# Patient Record
Sex: Male | Born: 1940 | Race: White | Hispanic: No | Marital: Married | Smoking: Never smoker
Health system: Southern US, Community
[De-identification: ages and names within clinical notes are randomized; demographics above are authoritative.]

## PROBLEM LIST (undated history)

## (undated) DIAGNOSIS — K635 Polyp of colon: Secondary | ICD-10-CM

## (undated) DIAGNOSIS — R112 Nausea with vomiting, unspecified: Secondary | ICD-10-CM

## (undated) DIAGNOSIS — N529 Male erectile dysfunction, unspecified: Secondary | ICD-10-CM

## (undated) DIAGNOSIS — I509 Heart failure, unspecified: Secondary | ICD-10-CM

## (undated) DIAGNOSIS — I2510 Atherosclerotic heart disease of native coronary artery without angina pectoris: Secondary | ICD-10-CM

## (undated) DIAGNOSIS — K219 Gastro-esophageal reflux disease without esophagitis: Secondary | ICD-10-CM

## (undated) DIAGNOSIS — F329 Major depressive disorder, single episode, unspecified: Secondary | ICD-10-CM

## (undated) DIAGNOSIS — N289 Disorder of kidney and ureter, unspecified: Secondary | ICD-10-CM

## (undated) DIAGNOSIS — G479 Sleep disorder, unspecified: Secondary | ICD-10-CM

## (undated) DIAGNOSIS — F32A Depression, unspecified: Secondary | ICD-10-CM

## (undated) DIAGNOSIS — Z9889 Other specified postprocedural states: Secondary | ICD-10-CM

## (undated) DIAGNOSIS — L301 Dyshidrosis [pompholyx]: Secondary | ICD-10-CM

## (undated) DIAGNOSIS — M1990 Unspecified osteoarthritis, unspecified site: Secondary | ICD-10-CM

## (undated) DIAGNOSIS — G2581 Restless legs syndrome: Secondary | ICD-10-CM

## (undated) HISTORY — PX: COLONOSCOPY: SHX174

## (undated) HISTORY — PX: UPPER GASTROINTESTINAL ENDOSCOPY: SHX188

## (undated) HISTORY — DX: Major depressive disorder, single episode, unspecified: F32.9

## (undated) HISTORY — DX: Polyp of colon: K63.5

## (undated) HISTORY — DX: Restless legs syndrome: G25.81

## (undated) HISTORY — DX: Sleep disorder, unspecified: G47.9

## (undated) HISTORY — DX: Nausea with vomiting, unspecified: Z98.890

## (undated) HISTORY — DX: Dyshidrosis (pompholyx): L30.1

## (undated) HISTORY — DX: Heart failure, unspecified: I50.9

## (undated) HISTORY — DX: Atherosclerotic heart disease of native coronary artery without angina pectoris: I25.10

## (undated) HISTORY — DX: Nausea with vomiting, unspecified: R11.2

## (undated) HISTORY — DX: Male erectile dysfunction, unspecified: N52.9

## (undated) HISTORY — DX: Unspecified osteoarthritis, unspecified site: M19.90

## (undated) HISTORY — DX: Gastro-esophageal reflux disease without esophagitis: K21.9

## (undated) HISTORY — PX: KIDNEY STONE SURGERY: SHX686

## (undated) HISTORY — DX: Depression, unspecified: F32.A

---

## 1994-06-10 HISTORY — PX: ESOPHAGOGASTRIC FUNDOPLICATION: SHX405

## 1997-12-02 ENCOUNTER — Emergency Department (HOSPITAL_COMMUNITY): Admission: EM | Admit: 1997-12-02 | Discharge: 1997-12-02 | Payer: Self-pay | Admitting: *Deleted

## 2000-04-28 ENCOUNTER — Ambulatory Visit (HOSPITAL_COMMUNITY): Admission: RE | Admit: 2000-04-28 | Discharge: 2000-04-28 | Payer: Self-pay | Admitting: Orthopedic Surgery

## 2000-04-28 ENCOUNTER — Encounter: Payer: Self-pay | Admitting: Orthopedic Surgery

## 2003-05-11 HISTORY — PX: CARPAL TUNNEL RELEASE: SHX101

## 2003-05-20 ENCOUNTER — Ambulatory Visit (HOSPITAL_BASED_OUTPATIENT_CLINIC_OR_DEPARTMENT_OTHER): Admission: RE | Admit: 2003-05-20 | Discharge: 2003-05-20 | Payer: Self-pay | Admitting: Orthopedic Surgery

## 2004-02-27 ENCOUNTER — Encounter: Payer: Self-pay | Admitting: Internal Medicine

## 2004-07-23 ENCOUNTER — Ambulatory Visit: Payer: Self-pay | Admitting: Internal Medicine

## 2004-08-02 ENCOUNTER — Ambulatory Visit: Payer: Self-pay | Admitting: Internal Medicine

## 2004-12-19 ENCOUNTER — Encounter: Admission: RE | Admit: 2004-12-19 | Discharge: 2004-12-19 | Payer: Self-pay | Admitting: Sports Medicine

## 2005-04-12 ENCOUNTER — Ambulatory Visit: Payer: Self-pay | Admitting: Internal Medicine

## 2005-07-31 ENCOUNTER — Encounter: Payer: Self-pay | Admitting: Internal Medicine

## 2005-10-31 ENCOUNTER — Ambulatory Visit: Payer: Self-pay | Admitting: Internal Medicine

## 2005-11-03 ENCOUNTER — Encounter: Admission: RE | Admit: 2005-11-03 | Discharge: 2005-11-03 | Payer: Self-pay | Admitting: Orthopedic Surgery

## 2005-12-03 ENCOUNTER — Ambulatory Visit (HOSPITAL_COMMUNITY): Admission: RE | Admit: 2005-12-03 | Discharge: 2005-12-03 | Payer: Self-pay | Admitting: Orthopedic Surgery

## 2005-12-18 ENCOUNTER — Ambulatory Visit: Payer: Self-pay | Admitting: Gastroenterology

## 2006-01-03 ENCOUNTER — Ambulatory Visit: Payer: Self-pay | Admitting: Gastroenterology

## 2006-06-26 ENCOUNTER — Ambulatory Visit (HOSPITAL_BASED_OUTPATIENT_CLINIC_OR_DEPARTMENT_OTHER): Admission: RE | Admit: 2006-06-26 | Discharge: 2006-06-26 | Payer: Self-pay | Admitting: Orthopedic Surgery

## 2006-06-26 ENCOUNTER — Encounter (INDEPENDENT_AMBULATORY_CARE_PROVIDER_SITE_OTHER): Payer: Self-pay | Admitting: Specialist

## 2006-09-15 ENCOUNTER — Ambulatory Visit: Payer: Self-pay | Admitting: Internal Medicine

## 2006-09-15 LAB — CONVERTED CEMR LAB
Albumin: 3.9 g/dL (ref 3.5–5.2)
Alkaline Phosphatase: 79 units/L (ref 39–117)
BUN: 12 mg/dL (ref 6–23)
Basophils Relative: 0.9 % (ref 0.0–1.0)
Calcium: 9.5 mg/dL (ref 8.4–10.5)
Chloride: 105 meq/L (ref 96–112)
Creatinine, Ser: 1 mg/dL (ref 0.4–1.5)
GFR calc non Af Amer: 79 mL/min
Glucose, Bld: 105 mg/dL — ABNORMAL HIGH (ref 70–99)
MCHC: 32.9 g/dL (ref 30.0–36.0)
Monocytes Relative: 9.3 % (ref 3.0–11.0)
Neutro Abs: 3.1 10*3/uL (ref 1.4–7.7)
Neutrophils Relative %: 51.7 % (ref 43.0–77.0)
Platelets: 265 10*3/uL (ref 150–400)
RBC: 4.83 M/uL (ref 4.22–5.81)
Sodium: 140 meq/L (ref 135–145)
Total Bilirubin: 1.1 mg/dL (ref 0.3–1.2)
Total Protein: 7.3 g/dL (ref 6.0–8.3)

## 2006-10-20 DIAGNOSIS — K219 Gastro-esophageal reflux disease without esophagitis: Secondary | ICD-10-CM | POA: Insufficient documentation

## 2006-10-20 DIAGNOSIS — G479 Sleep disorder, unspecified: Secondary | ICD-10-CM | POA: Insufficient documentation

## 2006-10-20 DIAGNOSIS — L301 Dyshidrosis [pompholyx]: Secondary | ICD-10-CM

## 2006-10-20 DIAGNOSIS — G2581 Restless legs syndrome: Secondary | ICD-10-CM

## 2006-10-20 DIAGNOSIS — F329 Major depressive disorder, single episode, unspecified: Secondary | ICD-10-CM | POA: Insufficient documentation

## 2006-10-20 HISTORY — DX: Restless legs syndrome: G25.81

## 2006-10-20 HISTORY — DX: Dyshidrosis (pompholyx): L30.1

## 2006-10-20 HISTORY — DX: Gastro-esophageal reflux disease without esophagitis: K21.9

## 2006-10-27 ENCOUNTER — Ambulatory Visit: Payer: Self-pay | Admitting: Internal Medicine

## 2006-10-27 DIAGNOSIS — R079 Chest pain, unspecified: Secondary | ICD-10-CM | POA: Insufficient documentation

## 2006-10-30 ENCOUNTER — Ambulatory Visit: Payer: Self-pay | Admitting: Cardiology

## 2006-10-31 ENCOUNTER — Inpatient Hospital Stay (HOSPITAL_BASED_OUTPATIENT_CLINIC_OR_DEPARTMENT_OTHER): Admission: RE | Admit: 2006-10-31 | Discharge: 2006-10-31 | Payer: Self-pay | Admitting: Cardiovascular Disease

## 2006-10-31 ENCOUNTER — Ambulatory Visit: Payer: Self-pay | Admitting: Cardiovascular Disease

## 2006-11-07 ENCOUNTER — Encounter: Payer: Self-pay | Admitting: Internal Medicine

## 2006-11-10 ENCOUNTER — Telehealth: Payer: Self-pay | Admitting: Internal Medicine

## 2006-11-10 ENCOUNTER — Ambulatory Visit: Payer: Self-pay | Admitting: Cardiology

## 2006-12-02 ENCOUNTER — Ambulatory Visit: Payer: Self-pay | Admitting: Pulmonary Disease

## 2006-12-02 LAB — CONVERTED CEMR LAB
Ferritin: 68.1 ng/mL (ref 22.0–322.0)
Iron: 85 ug/dL (ref 42–165)
Saturation Ratios: 19.7 % — ABNORMAL LOW (ref 20.0–50.0)
Transferrin: 307.9 mg/dL (ref >=212.0)

## 2007-01-05 ENCOUNTER — Ambulatory Visit: Payer: Self-pay | Admitting: Pulmonary Disease

## 2007-02-20 ENCOUNTER — Telehealth (INDEPENDENT_AMBULATORY_CARE_PROVIDER_SITE_OTHER): Payer: Self-pay | Admitting: *Deleted

## 2007-02-20 ENCOUNTER — Ambulatory Visit: Payer: Self-pay | Admitting: Internal Medicine

## 2007-02-24 ENCOUNTER — Ambulatory Visit: Payer: Self-pay | Admitting: Professional

## 2007-03-03 ENCOUNTER — Ambulatory Visit: Payer: Self-pay | Admitting: Professional

## 2007-03-10 ENCOUNTER — Ambulatory Visit: Payer: Self-pay | Admitting: Professional

## 2007-03-13 ENCOUNTER — Ambulatory Visit: Payer: Self-pay | Admitting: Internal Medicine

## 2007-03-17 ENCOUNTER — Ambulatory Visit: Payer: Self-pay | Admitting: Professional

## 2007-03-25 ENCOUNTER — Telehealth (INDEPENDENT_AMBULATORY_CARE_PROVIDER_SITE_OTHER): Payer: Self-pay | Admitting: *Deleted

## 2007-03-31 ENCOUNTER — Ambulatory Visit: Payer: Self-pay | Admitting: Professional

## 2007-04-07 ENCOUNTER — Ambulatory Visit: Payer: Self-pay | Admitting: Internal Medicine

## 2007-04-07 DIAGNOSIS — M159 Polyosteoarthritis, unspecified: Secondary | ICD-10-CM

## 2007-04-07 HISTORY — DX: Polyosteoarthritis, unspecified: M15.9

## 2007-04-30 ENCOUNTER — Telehealth (INDEPENDENT_AMBULATORY_CARE_PROVIDER_SITE_OTHER): Payer: Self-pay | Admitting: *Deleted

## 2007-07-06 ENCOUNTER — Ambulatory Visit: Payer: Self-pay | Admitting: Internal Medicine

## 2007-07-12 DIAGNOSIS — N509 Disorder of male genital organs, unspecified: Secondary | ICD-10-CM | POA: Insufficient documentation

## 2007-07-12 DIAGNOSIS — N453 Epididymo-orchitis: Secondary | ICD-10-CM | POA: Insufficient documentation

## 2007-07-15 ENCOUNTER — Ambulatory Visit: Payer: Self-pay | Admitting: Internal Medicine

## 2007-07-15 ENCOUNTER — Encounter (INDEPENDENT_AMBULATORY_CARE_PROVIDER_SITE_OTHER): Payer: Self-pay | Admitting: *Deleted

## 2007-07-15 LAB — CONVERTED CEMR LAB
Urobilinogen, UA: 0.2
pH: 5

## 2007-07-17 ENCOUNTER — Ambulatory Visit: Payer: Self-pay | Admitting: Internal Medicine

## 2007-07-20 ENCOUNTER — Encounter: Payer: Self-pay | Admitting: Internal Medicine

## 2007-08-04 ENCOUNTER — Encounter: Payer: Self-pay | Admitting: Internal Medicine

## 2007-09-24 ENCOUNTER — Encounter: Payer: Self-pay | Admitting: Pulmonary Disease

## 2007-11-20 ENCOUNTER — Telehealth: Payer: Self-pay | Admitting: Family Medicine

## 2007-12-07 ENCOUNTER — Ambulatory Visit: Payer: Self-pay | Admitting: *Deleted

## 2007-12-08 ENCOUNTER — Inpatient Hospital Stay (HOSPITAL_COMMUNITY): Admission: EM | Admit: 2007-12-08 | Discharge: 2007-12-08 | Payer: Self-pay | Admitting: Emergency Medicine

## 2007-12-08 ENCOUNTER — Encounter: Payer: Self-pay | Admitting: Internal Medicine

## 2007-12-28 ENCOUNTER — Telehealth (INDEPENDENT_AMBULATORY_CARE_PROVIDER_SITE_OTHER): Payer: Self-pay | Admitting: *Deleted

## 2008-03-02 ENCOUNTER — Telehealth (INDEPENDENT_AMBULATORY_CARE_PROVIDER_SITE_OTHER): Payer: Self-pay | Admitting: *Deleted

## 2008-03-28 ENCOUNTER — Emergency Department (HOSPITAL_COMMUNITY): Admission: EM | Admit: 2008-03-28 | Discharge: 2008-03-28 | Payer: Self-pay | Admitting: Emergency Medicine

## 2008-03-29 ENCOUNTER — Encounter: Payer: Self-pay | Admitting: Internal Medicine

## 2008-03-29 ENCOUNTER — Ambulatory Visit (HOSPITAL_BASED_OUTPATIENT_CLINIC_OR_DEPARTMENT_OTHER): Admission: RE | Admit: 2008-03-29 | Discharge: 2008-03-29 | Payer: Self-pay | Admitting: Urology

## 2008-05-10 HISTORY — PX: URETEROSCOPY: SHX842

## 2008-05-23 ENCOUNTER — Telehealth: Payer: Self-pay | Admitting: Internal Medicine

## 2008-06-01 ENCOUNTER — Telehealth: Payer: Self-pay | Admitting: Internal Medicine

## 2008-06-28 ENCOUNTER — Ambulatory Visit: Payer: Self-pay | Admitting: Internal Medicine

## 2008-07-20 ENCOUNTER — Ambulatory Visit: Payer: Self-pay | Admitting: Internal Medicine

## 2008-07-29 ENCOUNTER — Telehealth: Payer: Self-pay | Admitting: Family Medicine

## 2008-08-03 ENCOUNTER — Telehealth: Payer: Self-pay | Admitting: Internal Medicine

## 2008-08-15 ENCOUNTER — Telehealth: Payer: Self-pay | Admitting: Internal Medicine

## 2008-08-31 ENCOUNTER — Telehealth: Payer: Self-pay | Admitting: Family Medicine

## 2008-09-26 ENCOUNTER — Telehealth: Payer: Self-pay | Admitting: Internal Medicine

## 2008-10-08 HISTORY — PX: CATARACT EXTRACTION: SUR2

## 2008-11-08 HISTORY — PX: CATARACT EXTRACTION: SUR2

## 2008-11-14 ENCOUNTER — Ambulatory Visit: Payer: Self-pay | Admitting: Internal Medicine

## 2009-01-13 ENCOUNTER — Ambulatory Visit: Payer: Self-pay | Admitting: Internal Medicine

## 2009-01-13 DIAGNOSIS — R1013 Epigastric pain: Secondary | ICD-10-CM | POA: Insufficient documentation

## 2009-01-16 ENCOUNTER — Encounter: Admission: RE | Admit: 2009-01-16 | Discharge: 2009-01-16 | Payer: Self-pay | Admitting: Internal Medicine

## 2009-01-16 LAB — CONVERTED CEMR LAB
ALT: 18 units/L (ref 0–53)
AST: 17 units/L (ref 0–37)
Albumin: 4.4 g/dL (ref 3.5–5.2)
Alkaline Phosphatase: 73 units/L (ref 39–117)
Amylase: 30 units/L (ref 0–105)
BUN: 16 mg/dL (ref 6–23)
Calcium: 9.3 mg/dL (ref 8.4–10.5)
Creatinine, Ser: 1.02 mg/dL (ref 0.40–1.50)
Eosinophils Absolute: 0.1 10*3/uL (ref 0.0–0.7)
Eosinophils Relative: 1 % (ref 0–5)
HCT: 45.3 % (ref 39.0–52.0)
Neutrophils Relative %: 57 % (ref 43–77)
Platelets: 281 10*3/uL (ref 150–400)
RBC: 4.89 M/uL (ref 4.22–5.81)
RDW: 14.2 % (ref 11.5–15.5)
TSH: 1.341 microintl units/mL (ref 0.350–4.500)
Total Bilirubin: 0.9 mg/dL (ref 0.3–1.2)
Total Protein: 7.4 g/dL (ref 6.0–8.3)

## 2009-01-17 ENCOUNTER — Ambulatory Visit: Payer: Self-pay | Admitting: Cardiology

## 2009-01-17 ENCOUNTER — Encounter (INDEPENDENT_AMBULATORY_CARE_PROVIDER_SITE_OTHER): Payer: Self-pay | Admitting: *Deleted

## 2009-01-18 ENCOUNTER — Encounter (INDEPENDENT_AMBULATORY_CARE_PROVIDER_SITE_OTHER): Payer: Self-pay | Admitting: *Deleted

## 2009-01-18 ENCOUNTER — Ambulatory Visit: Payer: Self-pay | Admitting: Gastroenterology

## 2009-01-18 ENCOUNTER — Inpatient Hospital Stay (HOSPITAL_COMMUNITY): Admission: EM | Admit: 2009-01-18 | Discharge: 2009-01-20 | Payer: Self-pay | Admitting: Emergency Medicine

## 2009-01-18 ENCOUNTER — Ambulatory Visit: Payer: Self-pay | Admitting: Internal Medicine

## 2009-01-19 ENCOUNTER — Encounter (INDEPENDENT_AMBULATORY_CARE_PROVIDER_SITE_OTHER): Payer: Self-pay | Admitting: *Deleted

## 2009-01-19 ENCOUNTER — Encounter: Payer: Self-pay | Admitting: Gastroenterology

## 2009-01-19 ENCOUNTER — Encounter (INDEPENDENT_AMBULATORY_CARE_PROVIDER_SITE_OTHER): Payer: Self-pay | Admitting: Internal Medicine

## 2009-01-19 ENCOUNTER — Ambulatory Visit: Payer: Self-pay | Admitting: Internal Medicine

## 2009-01-25 ENCOUNTER — Ambulatory Visit: Payer: Self-pay | Admitting: Internal Medicine

## 2009-01-25 DIAGNOSIS — I420 Dilated cardiomyopathy: Secondary | ICD-10-CM

## 2009-01-25 DIAGNOSIS — K2990 Gastroduodenitis, unspecified, without bleeding: Secondary | ICD-10-CM

## 2009-01-25 DIAGNOSIS — I255 Ischemic cardiomyopathy: Secondary | ICD-10-CM | POA: Insufficient documentation

## 2009-01-25 DIAGNOSIS — K2970 Gastritis, unspecified, without bleeding: Secondary | ICD-10-CM | POA: Insufficient documentation

## 2009-01-25 HISTORY — DX: Ischemic cardiomyopathy: I25.5

## 2009-02-07 ENCOUNTER — Ambulatory Visit: Payer: Self-pay | Admitting: Cardiology

## 2009-02-08 ENCOUNTER — Telehealth: Payer: Self-pay | Admitting: Internal Medicine

## 2009-03-07 ENCOUNTER — Ambulatory Visit: Payer: Self-pay | Admitting: Gastroenterology

## 2009-03-07 DIAGNOSIS — Z8719 Personal history of other diseases of the digestive system: Secondary | ICD-10-CM | POA: Insufficient documentation

## 2009-04-02 ENCOUNTER — Emergency Department (HOSPITAL_COMMUNITY): Admission: EM | Admit: 2009-04-02 | Discharge: 2009-04-02 | Payer: Self-pay | Admitting: Emergency Medicine

## 2009-04-03 ENCOUNTER — Ambulatory Visit: Payer: Self-pay | Admitting: Gastroenterology

## 2009-04-03 ENCOUNTER — Telehealth (INDEPENDENT_AMBULATORY_CARE_PROVIDER_SITE_OTHER): Payer: Self-pay | Admitting: *Deleted

## 2009-04-03 DIAGNOSIS — K8020 Calculus of gallbladder without cholecystitis without obstruction: Secondary | ICD-10-CM | POA: Insufficient documentation

## 2009-04-06 ENCOUNTER — Telehealth: Payer: Self-pay | Admitting: Internal Medicine

## 2009-04-07 ENCOUNTER — Telehealth: Payer: Self-pay | Admitting: Cardiology

## 2009-04-07 ENCOUNTER — Encounter: Payer: Self-pay | Admitting: Cardiology

## 2009-04-10 HISTORY — PX: LAPAROSCOPIC CHOLECYSTECTOMY: SUR755

## 2009-04-11 ENCOUNTER — Telehealth: Payer: Self-pay | Admitting: Cardiology

## 2009-04-12 ENCOUNTER — Ambulatory Visit: Payer: Self-pay | Admitting: Cardiology

## 2009-04-12 ENCOUNTER — Encounter: Payer: Self-pay | Admitting: Physician Assistant

## 2009-04-13 ENCOUNTER — Telehealth (INDEPENDENT_AMBULATORY_CARE_PROVIDER_SITE_OTHER): Payer: Self-pay | Admitting: *Deleted

## 2009-04-17 ENCOUNTER — Encounter (INDEPENDENT_AMBULATORY_CARE_PROVIDER_SITE_OTHER): Payer: Self-pay | Admitting: Surgery

## 2009-04-17 ENCOUNTER — Ambulatory Visit (HOSPITAL_COMMUNITY): Admission: RE | Admit: 2009-04-17 | Discharge: 2009-04-18 | Payer: Self-pay | Admitting: Surgery

## 2009-05-03 ENCOUNTER — Encounter: Payer: Self-pay | Admitting: Internal Medicine

## 2009-05-24 ENCOUNTER — Telehealth: Payer: Self-pay | Admitting: Gastroenterology

## 2009-06-13 ENCOUNTER — Ambulatory Visit: Payer: Self-pay | Admitting: Gastroenterology

## 2009-06-27 ENCOUNTER — Ambulatory Visit: Payer: Self-pay | Admitting: Cardiology

## 2009-06-27 DIAGNOSIS — I10 Essential (primary) hypertension: Secondary | ICD-10-CM | POA: Insufficient documentation

## 2009-06-27 DIAGNOSIS — R002 Palpitations: Secondary | ICD-10-CM | POA: Insufficient documentation

## 2009-09-19 ENCOUNTER — Encounter (INDEPENDENT_AMBULATORY_CARE_PROVIDER_SITE_OTHER): Payer: Self-pay | Admitting: *Deleted

## 2009-12-25 ENCOUNTER — Ambulatory Visit: Payer: Self-pay | Admitting: Internal Medicine

## 2010-01-19 ENCOUNTER — Telehealth: Payer: Self-pay | Admitting: Internal Medicine

## 2010-01-24 ENCOUNTER — Telehealth: Payer: Self-pay | Admitting: Cardiology

## 2010-01-25 ENCOUNTER — Ambulatory Visit: Payer: Self-pay | Admitting: Cardiovascular Disease

## 2010-01-25 DIAGNOSIS — I5022 Chronic systolic (congestive) heart failure: Secondary | ICD-10-CM | POA: Insufficient documentation

## 2010-01-25 DIAGNOSIS — I5023 Acute on chronic systolic (congestive) heart failure: Secondary | ICD-10-CM

## 2010-04-06 ENCOUNTER — Telehealth: Payer: Self-pay | Admitting: Internal Medicine

## 2010-06-26 ENCOUNTER — Ambulatory Visit
Admission: RE | Admit: 2010-06-26 | Discharge: 2010-06-26 | Payer: Self-pay | Source: Home / Self Care | Attending: Internal Medicine | Admitting: Internal Medicine

## 2010-06-26 ENCOUNTER — Other Ambulatory Visit: Payer: Self-pay | Admitting: Internal Medicine

## 2010-06-26 LAB — CBC WITH DIFFERENTIAL/PLATELET
Basophils Absolute: 0.1 10*3/uL (ref 0.0–0.1)
Basophils Relative: 0.6 % (ref 0.0–3.0)
Eosinophils Absolute: 0 10*3/uL (ref 0.0–0.7)
Eosinophils Relative: 0.3 % (ref 0.0–5.0)
HCT: 45.5 % (ref 39.0–52.0)
Hemoglobin: 15.3 g/dL (ref 13.0–17.0)
Lymphocytes Relative: 32.6 % (ref 12.0–46.0)
Lymphs Abs: 2.7 10*3/uL (ref 0.7–4.0)
MCHC: 33.7 g/dL (ref 30.0–36.0)
MCV: 91 fl (ref 78.0–100.0)
Monocytes Absolute: 0.5 10*3/uL (ref 0.1–1.0)
Monocytes Relative: 6.2 % (ref 3.0–12.0)
Neutro Abs: 5 10*3/uL (ref 1.4–7.7)
Neutrophils Relative %: 60.3 % (ref 43.0–77.0)
Platelets: 276 10*3/uL (ref 150.0–400.0)
RBC: 4.99 Mil/uL (ref 4.22–5.81)
RDW: 13.7 % (ref 11.5–14.6)
WBC: 8.2 10*3/uL (ref 4.5–10.5)

## 2010-06-26 LAB — RENAL FUNCTION PANEL
Albumin: 4.6 g/dL (ref 3.5–5.2)
BUN: 14 mg/dL (ref 6–23)
CO2: 27 mEq/L (ref 19–32)
Calcium: 10 mg/dL (ref 8.4–10.5)
Chloride: 102 mEq/L (ref 96–112)
Creatinine, Ser: 1.2 mg/dL (ref 0.4–1.5)
GFR: 65.2 mL/min (ref >60.00)
Glucose, Bld: 114 mg/dL — ABNORMAL HIGH (ref 70–99)
Phosphorus: 3.5 mg/dL (ref 2.3–4.6)
Potassium: 5.7 mEq/L — ABNORMAL HIGH (ref 3.5–5.1)
Sodium: 140 mEq/L (ref 135–145)

## 2010-06-26 LAB — HEPATIC FUNCTION PANEL
ALT: 18 U/L (ref 0–53)
AST: 19 U/L (ref 0–37)
Albumin: 4.6 g/dL (ref 3.5–5.2)
Alkaline Phosphatase: 70 U/L (ref 39–117)
Bilirubin, Direct: 0.2 mg/dL (ref 0.0–0.3)
Total Bilirubin: 1.4 mg/dL — ABNORMAL HIGH (ref 0.3–1.2)
Total Protein: 8.1 g/dL (ref 6.0–8.3)

## 2010-06-26 LAB — IBC PANEL
Iron: 90 ug/dL (ref 42–165)
Saturation Ratios: 19.2 % — ABNORMAL LOW (ref 20.0–50.0)
Transferrin: 335.1 mg/dL (ref 212.0–360.0)

## 2010-06-26 LAB — TSH: TSH: 1.21 u[IU]/mL (ref 0.35–5.50)

## 2010-06-26 LAB — FERRITIN: Ferritin: 54.4 ng/mL (ref 22.0–322.0)

## 2010-07-08 LAB — CONVERTED CEMR LAB
BUN: 15 mg/dL (ref 6–23)
CO2: 28 meq/L (ref 19–32)
Creatinine, Ser: 1.1 mg/dL (ref 0.4–1.5)
GFR calc non Af Amer: 69.36 mL/min (ref >60)
Glucose, Bld: 148 mg/dL — ABNORMAL HIGH (ref 70–99)
Potassium: 3.7 meq/L (ref 3.5–5.1)
Sodium: 139 meq/L (ref 135–145)

## 2010-07-10 NOTE — Assessment & Plan Note (Signed)
Summary: Add on Visit  Medications Added FUROSEMIDE 40 MG TABS (FUROSEMIDE) Take one tablet by mouth daily for 3 days, then use as needed for increased weight        Visit Type:  Follow-up Referring Caleb Chavez:  n/a Primary Caleb Chavez:  Caleb Abide, MD  CC:  DOD add on- Chest heavines - ankles edema and numbness on hands and ankles.  History of Present Illness: This is a 32 year gentleman with nonischemic cardiomyopathy with LVEF approximately 40% presenting for evaluation of edema. He is followed by Dr Caleb Chavez. The patient complains of about 3-4 days of swelling in his ankles and hands. He has been on vacation at the beach, but denies excess salt intake or dietary indiscretion. He also complains of a heaviness in the lateral chest which he has had at times in the past. He denies exertional chest pain or pressure. He has been very worried about his symptoms and interrupted his vacation to drive back for evaluation.  He denies cough, fever, chills, palps, lightheadedness, or other complaints at this time.  Current Medications (verified): 1)  Clonazepam 2 Mg Tabs (Clonazepam) .... Take 1/2-1 Tablet By Mouth At Bedtime 2)  Mirapex 0.25 Mg  Tabs (Pramipexole Dihydrochloride) .... Take 1 Tablet By Mouth At Bedtime 3)  Pantoprazole Sodium 40 Mg Tbec (Pantoprazole Sodium) .... Take 1 By Mouth Once A Day, 20-30 Min Before A Meal. 4)  Lisinopril 10 Mg Tabs (Lisinopril) .... One and 1/2 Tablets Twice A Day 5)  Mirtazapine 15 Mg Tabs (Mirtazapine) .Marland Kitchen.. 1 Tab At Bedtime To Help Sleep and Mood  Allergies: 1)  Penicillin G Potassium (Penicillin G Potassium)  Past History:  Past medical history reviewed for relevance to current acute and chronic problems.  Past Medical History: Reviewed history from 02/07/2009 and no changes required. 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 HEENT: normal Neck: normal carotid upstrokes without bruits, JVP normal Lungs: CTA CV: RRR without murmur or gallop Abd: soft, NT, positive BS, no bruit, no organomegaly Ext: trace bilateral pretibial and hand edema. peripheral pulses 2+ and equal Skin: warm and dry without rash    EKG  Procedure date:  01/25/2010  Findings:      NSR with LBBB 79 bpm  Impression & Recommendations:  Problem # 1:  ACUTE ON CHRONIC SYSTOLIC HEART FAILURE (ICD-428.23) Symptoms suggestive of CHF in this patient with known LV dysfunction with LBBB, but his exam is consistent with only minimal volume excess. Will check a BNP level to help determine the etiology of his symptoms and will add a short course of furosemide as a therapeutic challenge. After the initial 3 days of furosemide, I have suggested as needed use based on his weight and perceived presence of edema. Will arrange  f/u with Dr Caleb Chavez in one month.  His updated medication list for this problem includes:    Lisinopril 10 Mg Tabs (Lisinopril) ..... One and 1/2 tablets twice a day    Furosemide 40 Mg Tabs (Furosemide) .Marland Kitchen... Take one tablet by mouth daily for 3 days, then use as needed for increased weight  Orders: EKG w/ Interpretation (93000) TLB-BMP (Basic Metabolic Panel-BMET) (80048-METABOL) TLB-BNP (B-Natriuretic Peptide) (83880-BNPR)  Problem # 2:  CHEST PAIN (ICD-786.50) Atypical pain in pt with nonobstructive CAD by cath in 2008. Will follow with observation  for now and he will call if symptoms progress.  His updated medication list for this problem includes:    Lisinopril 10 Mg Tabs (Lisinopril) ..... One and 1/2 tablets twice a day  Patient Instructions: 1)  Your physician recommends that you schedule a follow-up appointment in: 1 MONTH with Dr Caleb Chavez 2)  Your physician recommends that you have lab work today: BMP, BNP 3)  Your physician has recommended you make the following change in your medication: START Lasix 40mg  one tablet by mouth once daily for 3 days,  please use Lasix as needed for increased weight Prescriptions: FUROSEMIDE 40 MG TABS (FUROSEMIDE) Take one tablet by mouth daily for 3 days, then use as needed for increased weight  #30 x 1   Entered by:   Julieta Gutting, RN, BSN   Authorized by:   Norva Karvonen, MD   Signed by:   Julieta Gutting, RN, BSN on 01/25/2010   Method used:   Electronically to        [facility]* (retail)       [address]       [place], [place]  [address]       Ph: [phone]       Fax: [phone]   RxID:   [id number]

## 2010-07-10 NOTE — Assessment & Plan Note (Signed)
Summary: per check out/sf  Medications Added LISINOPRIL 10 MG TABS (LISINOPRIL) one and 1/2 tablets twice a day      Allergies Added:   Visit Type:  Follow-up Referring Provider:  n/a Primary Provider:  [name], MD  CC:  Cardiomyopathy.  History of Present Illness: The patient presents for followup of his cardiomyopathy. We saw him last at the end of last in November prior to cholecystectomy. He had his lisinopril titrated at that time. No further cardiovascular testing was needed. He did relatively well with the cholecystectomy. He still having stomach problems and is seeing Dr. Christella Hartigan. He does report shortness of breath with activities such as walking a moderate distance on level ground. He is not describing resting shortness of breath, PND or orthopnea. He is not describing chest discomfort, neck or arm discomfort. He does have occasional fluttering in his chest. This is sporadic. It is short lived. It is not associated with presyncope or syncope. It happens with activity or rest.  Current Medications (verified): 1)  Clonazepam 2 Mg Tabs (Clonazepam) .... Take 1/2-1 Tablet By Mouth At Bedtime 2)  Mirapex 0.25 Mg  Tabs (Pramipexole Dihydrochloride) .... Take 1 Tablet By Mouth At Bedtime 3)  Citalopram Hydrobromide 40 Mg Tabs (Citalopram Hydrobromide) .... Take One By Mouth Daily 4)  Wellbutrin Sr 150 Mg Xr12h-Tab (Bupropion Hcl) .... Take 1 Tablet By Mouth in The Morning Then 6 Hours Later Take Another 5)  Pantoprazole Sodium 40 Mg Tbec (Pantoprazole Sodium) .... Take 1 By Mouth Once A Day, 20-30 Min Before A Meal. 6)  Lisinopril 10 Mg Tabs (Lisinopril) .... One By Mouth Twice A Day  Allergies (verified): 1)  Penicillin G Potassium (Penicillin G Potassium)  Past History:  Past Medical History: Reviewed history from 02/07/2009 and no changes required. Depression GERD Restless legs syndrome/sleep disturbance Dyshydrotic eczema Osteoarthritis Gastritis/duodenitis CHF (EF  40%, echo 8/10) Non obstructive CAD (30-40% Circ, 40% RCA AM, 2008)  Past Surgical History: Fundoplication/scope [facility] 1996 Kidney stone 1997/2000 Right CTS- [place]  12/04 12/09 Ureteroscopy for stones --[place] Left cataract 5/10, right 6/10 8/10  Hospitalized for abd pain. EGD shows gastritis & duodenitis Lap cholecystectomy  11/10                Dr Michaell Cowing  Review of Systems       As stated in the HPI and negative for all other systems.   Vital Signs:  Patient profile:   70 year old male Height:      75 inches Weight:      234 pounds BMI:     29.35 Pulse rate:   60 / minute Resp:     16 per minute BP sitting:   152 / 87  (right arm)  Vitals Entered By: Marrion Coy, CNA (June 27, 2009 9:17 AM)  Physical Exam  General:  Well developed, well nourished, in no acute distress. Head:  normocephalic and atraumatic Eyes:  PERRLA/EOM intact; conjunctiva and lids normal. Mouth:  Teeth, gums and palate normal. Oral mucosa normal. Neck:  Neck supple, no JVD. No masses, thyromegaly or abnormal cervical nodes. Chest Wall:  no deformities or breast masses noted Lungs:  Clear bilaterally to auscultation and percussion. Abdomen:  Bowel sounds positive; abdomen soft and non-tender without masses, organomegaly, or hernias noted. No hepatosplenomegaly. Msk:  Back normal, normal gait. Muscle strength and tone normal. Extremities:  No clubbing or cyanosis. Neurologic:  Alert and oriented x 3. Skin:  Intact without lesions or rashes. Cervical Nodes:  no significant adenopathy Axillary Nodes:  no significant adenopathy Psych:  Normal affect.   Detailed Cardiovascular Exam  Neck    Carotids: Carotids full and equal bilaterally without bruits.      Neck Veins: Normal, no JVD.    Heart    Inspection: no deformities or lifts noted.      Palpation: normal PMI with no thrills palpable.      Auscultation: regular rate and rhythm, S1, S2 without murmurs, rubs, gallops, or clicks.     Vascular    Abdominal Aorta: no palpable masses, pulsations, or audible bruits.      Femoral Pulses: normal femoral pulses bilaterally.      Pedal Pulses: normal pedal pulses bilaterally.      Radial Pulses: normal radial pulses bilaterally.      Peripheral Circulation: no clubbing, cyanosis, or edema noted with normal capillary refill.     Impression & Recommendations:  Problem # 1:  CHRONIC SYSTOLIC HEART FAILURE (ICD-428.22) The patient does have mild LV dysfunction nonischemic. He is somewhat dyspneic. I will check a BNP level. I will titrate his lisinopril to 15 mg twice daily. If his BNP is elevated I will consider adding a low dose diuretic though he does not appear to be overtly volume overloaded by this exam. Orders: TLB-BNP (B-Natriuretic Peptide) (83880-BNPR)  Problem # 2:  ESSENTIAL HYPERTENSION, BENIGN (ICD-401.1) His blood pressure is slightly elevated which allows me to up titrate his medications.  Problem # 3:  PALPITATIONS, OCCASIONAL (ICD-785.1) We discussed these. These are not particularly symptomatic. He will let me know if they get worse at which point we could pursue these. His heart rate is marginal but I do think he will tolerate a low dose of beta blocker which would also manage his mild cardiomyopathy.  Patient Instructions: 1)  Your physician recommends that you schedule a follow-up appointment in: 3 months with Dr Antoine Poche 2)  Your physician has recommended you make the following change in your medication: Increase Lisinopril 10mg  to one and 1/2 tablets twice a day. 3)  You have been diagnosed with Congestive Heart Failure or CHF.  CHF is a condition in which a problem with the structure or function of the heart impairs its ability to supply sufficient blood flow to meet the body's needs.  For further information please visit www.cardiosmart.org for detailed information on CHF. 4)  Your physician recommends that you weigh, daily, at the same time every day,  and in the same amount of clothing.  Please record your daily weights on the handout provided and bring it to your next appointment. Prescriptions: LISINOPRIL 10 MG TABS (LISINOPRIL) one and 1/2 tablets twice a day  #90 x 6   Entered by:   Charolotte Capuchin, RN   Authorized by:   Rollene Rotunda, MD, [facility]   Signed by:   Charolotte Capuchin, RN on 06/27/2009   Method used:   Electronically to        [facility]* (retail)       [address]       [place], [place]  [address]       Ph: [phone]       Fax: [phone]   RxID:   [id number]

## 2010-07-10 NOTE — Assessment & Plan Note (Signed)
Review of gastrointestinal problems: 1. Severe gastritis, duodenitis: resented with epigastric discomfort, nausea to the hospital August 2010. EGD revealed Severe gastritis, duodenitis, biopsies were H. pylori negative. He was recommended to avoid NSAIDs completely and take proton pump inhibitor twice daily (was only on baby ASA a day).  Repeat EGD, October 2010  Gastritis, duodenitis was much improved.  2. Routine risk for colon cancer, status post colonoscopy 2007 that showed no polyps. Recall colonoscopy 2017 3. s/p fundoplication 1990s at [facility] for severe GERD symptoms (Dr. Jacquenette Shone), 4. symptomatic cholelithiasis, underwent endoscopic cholecystectomy 2010 november; for chronic cholecystitis and gallstones   History of Present Illness Visit Type: follow up  Primary GI MD: Rob Bunting MD Primary Provider: [name], MD Requesting Provider: n/a Chief Complaint: Bad taste in mouth and nausea  History of Present Illness:     very pleasant 70 year old man who had lap chole 2 months ago. for the past one to 2 months He can sometimes taste medicine in breath in evening.  He will cough, clear throat and will notice small dark particles of foul smelling material.  Seems like food goes slowly. Mild dysphagia. Can feel nauseas, bloated at times.  Does not take NSAIDs anymore.  no real changes in his bowels since GB removed.           Current Medications (verified): 1)  Clonazepam 2 Mg Tabs (Clonazepam) .... Take 1/2-1 Tablet By Mouth At Bedtime 2)  Mirapex 0.25 Mg  Tabs (Pramipexole Dihydrochloride) .... Take 1 Tablet By Mouth At Bedtime 3)  Citalopram Hydrobromide 40 Mg Tabs (Citalopram Hydrobromide) .... Take One By Mouth Daily 4)  Wellbutrin Sr 150 Mg Xr12h-Tab (Bupropion Hcl) .... Take 1 Tablet By Mouth in The Morning Then 6 Hours Later Take Another 5)  Pantoprazole Sodium 40 Mg Tbec (Pantoprazole Sodium) .... Take 1 By Mouth Once A Day, 20-30 Min Before A Meal. 6)  Lisinopril  10 Mg Tabs (Lisinopril) .... One By Mouth Twice A Day  Allergies (verified): 1)  Penicillin G Potassium (Penicillin G Potassium)  Vital Signs:  Patient profile:   70 year old male Height:      75 inches Weight:      228 pounds BMI:     28.60 BSA:     2.32 Pulse rate:   96 / minute Pulse rhythm:   regular BP sitting:   132 / 76  (left arm) Cuff size:   regular  Vitals Entered By: Ok Anis CMA (June 13, 2009 2:25 PM)  Physical Exam  Additional Exam:  Constitutional: generally well appearing Psychiatric: alert and oriented times 3 Abdomen: soft, non-tender, non-distended, normal bowel sounds    Impression & Recommendations:  Problem # 1:  Dyspepsia he has known gastritis, duodenitis there was probably NSAID related. He did not take NSAIDs anymore. These new symptoms seem to be related to have his gallbladder removed. I would like to simply increase his proton pump inhibitor to twice daily for now and observe how he does clinically. He'll return to see me in 4 weeks and sooner if needed.  Patient Instructions: 1)  Samples of nexium given.  Take one pill 20-30 min before dinner. 2)  Continue on protonix prior to breakfast. 3)  Return to see Dr. Christella Hartigan in one month. 4)  A copy of this information will be sent to Dr. Alphonsus Sias. 5)  The medication list was reviewed and reconciled.  All changed / newly prescribed medications were explained.  A complete medication list was  provided to the patient / caregiver.  Appended Document:  nexium samples given Samples Given #4   -Lot Number:  [id number]

## 2010-07-10 NOTE — Letter (Signed)
Summary: Appointment - Missed  [facility]     [place], [place]    Phone:   Fax:      September 19, 2009 MRN: [id number]   [facility] [address] [place], [place]  [address]   Dear Mr. Churchman,  Our records indicate you missed your appointment on 09-14-2009  with  Dr.Hochrein  It is very important that we reach you to reschedule this appointment. We look forward to participating in your health care needs. Please contact us at the number listed above at your earliest convenience to reschedule this appointment.     Sincerely,       Lorne Skeens  [facility] Scheduling Team

## 2010-07-10 NOTE — Assessment & Plan Note (Signed)
Summary: PER DR Wavie Hashimi/CLE   Vital Signs:  Patient profile:   70 year old male Weight:      215 pounds Temp:     98.5 degrees F oral Pulse rate:   64 / minute Pulse rhythm:   regular BP sitting:   130 / 70  (left arm) Cuff size:   regular  Vitals Entered By: Lowella Petties CMA (December 25, 2009 4:52 PM) CC: follow-up visit   History of Present Illness: has been following with cardiologist reviewed that he had gallbladder out last november Ulcers diagnosed---controlled with pantoprazole  Not sleeping well again did okay for a while but now only 3-4 times per night needs something for this Mood has been okay unless he is lying awake at night---then his mind starts running and bad memories, etc Does admit that his mood is not always great  Not happy with the viagra wants to try levitra  Allergies: 1)  Penicillin G Potassium (Penicillin G Potassium)  Past History:  Past medical, surgical, family and social histories (including risk factors) reviewed for relevance to current acute and chronic problems.  Past Medical History: Reviewed history from 02/07/2009 and no changes required. 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 Fundoplication/scope [facility] 1996 Kidney stone 1997/2000 Right CTS- [place]  12/04 12/09 Ureteroscopy for stones --[place] Left cataract 5/10, right 6/10 8/10  Hospitalized for abd pain. EGD shows gastritis & duodenitis Lap cholecystectomy  11/10                Dr Michaell Cowing  Family History: Reviewed history from 02/03/2009 and no changes required. Father: Died at age 87 MI, had 3 previous MI's and bypass Mother: Died at age 84,some kind of cancer No hx of DM or HTN  Social History: Reviewed history from 02/03/2009 and no  changes required. 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   Psych:  normally interactive, good eye contact, not anxious appearing, and dysphoric affect.     Impression & Recommendations:  Problem # 1:  SLEEP DISORDER (ICD-780.50) Assessment Deteriorated mood issues coming back some Not clear if thoughts keep him up (likely) or that he has thoughts of his ex- after he is up  appetite is poor ?some anhedonia  did speak to counsellor in past recommended that he restart this   Complete Medication List: 1)  Clonazepam 2 Mg Tabs (Clonazepam) .... Take 1/2-1 tablet by mouth at bedtime 2)  Mirapex 0.25 Mg Tabs (Pramipexole dihydrochloride) .... Take 1 tablet by mouth at bedtime 3)  Pantoprazole Sodium 40 Mg Tbec (Pantoprazole sodium) .... Take 1 by mouth once a day, 20-30 min before a meal. 4)  Lisinopril 10 Mg Tabs (Lisinopril) .... One and 1/2 tablets twice a day 5)  Mirtazapine 15 Mg Tabs (Mirtazapine) .Marland Kitchen.. 1 tab at bedtime to help sleep and mood  Patient Instructions: 1)  Please schedule a follow-up appointment in 4-6 weeks.  Prescriptions: MIRTAZAPINE 15 MG TABS (MIRTAZAPINE) 1 tab at bedtime to help sleep and mood  #30 x 5  Entered and Authorized by:   Cindee Salt MD   Signed by:   Cindee Salt MD on 12/25/2009   Method used:   Electronically to        [facility]* (retail)       [address]       [place], [place]  [address]       Ph: [phone]       Fax: [phone]   RxID:   [id number]   Prior Medications (reviewed today): CLONAZEPAM 2 MG TABS (CLONAZEPAM) Take 1/2-1 tablet by mouth at bedtime MIRAPEX 0.25 MG  TABS (PRAMIPEXOLE  DIHYDROCHLORIDE) Take 1 tablet by mouth at bedtime PANTOPRAZOLE SODIUM 40 MG TBEC (PANTOPRAZOLE SODIUM) take 1 by mouth once a day, 20-30 min before a meal. LISINOPRIL 10 MG TABS (LISINOPRIL) one and 1/2 tablets twice a day Current Allergies: PENICILLIN G POTASSIUM (PENICILLIN G POTASSIUM)

## 2010-07-10 NOTE — Progress Notes (Signed)
Summary: refill request for clonazepam  Phone Note Refill Request Message from:  Fax from Pharmacy  Refills Requested: Medication #1:  CLONAZEPAM 2 MG TABS Take 1/2-1 tablet by mouth at bedtime   Last Refilled: 12/14/2009 Faxed request from [facility] pharmacy is on  your desk.  Initial call taken by: Lowella Petties CMA,  January 19, 2010 11:28 AM  Follow-up for Phone Call        okay #30 x 3 Follow-up by: Cindee Salt MD,  January 19, 2010 1:10 PM  Additional Follow-up for Phone Call Additional follow up Details #1::        Rx faxed to pharmacy Additional Follow-up by: DeShannon Smith CMA Duncan Dull),  January 19, 2010 2:35 PM    Prescriptions: CLONAZEPAM 2 MG TABS (CLONAZEPAM) Take 1/2-1 tablet by mouth at bedtime  #30 x 3   Entered by:   Mervin Hack CMA (AAMA)   Authorized by:   Cindee Salt MD   Signed by:   Mervin Hack CMA (AAMA) on 01/19/2010   Method used:   Handwritten   RxID:   [id number]

## 2010-07-10 NOTE — Progress Notes (Signed)
Summary: chest heaviness,swelling ankles,numbness in ankles   Phone Note Call from Patient Call back at [phone]   Caller: Patient Summary of Call: Pt having swelling in ankles and numbness in both ankles and some heaviness in chest  Initial call taken by: Judie Grieve,  January 24, 2010 8:34 AM  Follow-up for Phone Call        I spoke with the pt and he c/o swelling in his ankles, 3 fingers on left hand are numb and chest heaviness.  The pt denies CP. The pt does have SOB normally and this has not worsened.  The pt denies eating salty foods and he has put his feet up without improvement in swelling.  The pt denies any left arm or left leg numbness or weakness.   The pt has had these symptoms for 3 days and they have been constant.  I tried to schedule an OV for today but the pt is currently in [facility].  I arranged an appt on 01/25/10 with Dr Excell Seltzer (DOD).  I instructed the pt to go to the local ER or Urgent Care is his symptoms change or worsen. Pt agreed with plan. Follow-up by: Julieta Gutting, RN, BSN,  January 24, 2010 8:45 AM

## 2010-07-10 NOTE — Progress Notes (Signed)
Summary: refill request for mirapex  Phone Note Refill Request Message from:  Fax from Pharmacy  Refills Requested: Medication #1:  MIRAPEX 0.25 MG  TABS Take 1 tablet by mouth at bedtime   Last Refilled: 03/12/2010 Faxed request from [facility] pharmacy is on your desk.  Initial call taken by: Lowella Petties CMA, AAMA,  April 06, 2010 9:33 AM  Follow-up for Phone Call        Rx completed in Dr. Tiajuana Amass Follow-up by: Cindee Salt MD,  April 06, 2010 12:57 PM    New/Updated Medications: MIRAPEX 0.25 MG  TABS (PRAMIPEXOLE DIHYDROCHLORIDE) Take 1 tablet by mouth at bedtime for restless legs Prescriptions: MIRAPEX 0.25 MG  TABS (PRAMIPEXOLE DIHYDROCHLORIDE) Take 1 tablet by mouth at bedtime for restless legs  #30 x 11   Entered and Authorized by:   Cindee Salt MD   Signed by:   Cindee Salt MD on 04/06/2010   Method used:   Electronically to        [facility]* (retail)       [address]       [place], [place]  [address]       Ph: [phone]       Fax: [phone]   RxID:   [id number]

## 2010-07-12 NOTE — Assessment & Plan Note (Signed)
Summary: ? ABOUT MEDS FOR RESTLESS LEG/RBH   Vital Signs:  Patient profile:   70 year old male Weight:      226 pounds Temp:     98.6 degrees F oral Pulse rate:   56 / minute Pulse rhythm:   regular BP sitting:   156 / 82  (left arm) Cuff size:   large  Vitals Entered By: Mervin Hack CMA Duncan Dull) (June 26, 2010 10:31 AM) CC: restless legs   History of Present Illness: Having trouble sleeping Legs seem to be worse Just about every night it starts when he lies down may last a few minutes to many hours  Gets sensation that if he doesn't move, "I'm about to explode" Improves with movement--so he rolls around and continuously moves around  Has to get out of bed and walk or get on floor and do exercises with his legs    Allergies: 1)  Penicillin G Potassium (Penicillin G Potassium)  Past History:  Past medical, surgical, family and social histories (including risk factors) reviewed for relevance to current acute and chronic problems.  Past Medical History: Reviewed history from 02/07/2009 and no changes required. 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 Fundoplication/scope [facility] 1996 Kidney stone 1997/2000 Right CTS- [place]  12/04 12/09 Ureteroscopy for stones --[place] Left cataract 5/10, right 6/10 8/10  Hospitalized for abd pain. EGD shows gastritis & duodenitis Lap cholecystectomy  11/10                Dr Michaell Cowing  Family History: Reviewed history from 02/03/2009 and no changes required. 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, theatre/television/film at [employer]   Impression & Recommendations:  Problem # 1:  RESTLESS LEG SYNDROME (ICD-333.94) Assessment Deteriorated  counselled more than half of 15 minute appt will recheck labs will increase the mirapex to 0.5 and allow 1-2 at bedtime  Orders: Venipuncture ([address]) TLB-IBC Pnl (Iron/FE;Transferrin) (83550-IBC) TLB-Renal Function Panel (80069-RENAL) TLB-CBC Platelet - w/Differential (85025-CBCD) TLB-Hepatic/Liver Function Pnl (80076-HEPATIC) TLB-TSH (Thyroid Stimulating Hormone) (84443-TSH) TLB-Ferritin (82728-FER)  Complete Medication List: 1)  Clonazepam 2 Mg Tabs (Clonazepam) .... Take 1/2-1 tablet by mouth at bedtime 2)  Pantoprazole Sodium 40 Mg Tbec (Pantoprazole sodium) .... Take 1 by mouth once a day, 20-30 min before a meal. 3)  Lisinopril 10 Mg Tabs (Lisinopril) .... One and 1/2 tablets twice a day 4)  Mirtazapine 15 Mg Tabs (Mirtazapine) .Marland Kitchen.. 1 tab at bedtime to help sleep and mood 5)  Furosemide 40 Mg Tabs (Furosemide) .... Take one tablet by mouth daily for 3 days, then use as needed for increased weight 6)  Pramipexole Dihydrochloride 0.5 Mg Tabs (Pramipexole dihydrochloride) .Marland Kitchen.. 1-2 tabs at bedtime for restless legs  Patient Instructions: 1)  Please schedule a follow-up appointment in 3 months .  Prescriptions: PRAMIPEXOLE DIHYDROCHLORIDE 0.5 MG TABS (PRAMIPEXOLE DIHYDROCHLORIDE) 1-2 tabs at bedtime for restless legs  #60 x 11   Entered and Authorized by:   Gerlene Burdock  Dia Crawford MD   Signed by:   Cindee Salt MD on 06/26/2010   Method used:   Electronically to        [facility]* (retail)       [address]       [place], [place]  [address]       Ph: [phone]       Fax: [phone]   RxID:   [id number]    Orders Added: 1)  Est. Patient Level III [84132] 2)   Venipuncture [[address]] 3)  TLB-IBC Pnl (Iron/FE;Transferrin) [83550-IBC] 4)  TLB-Renal Function Panel [80069-RENAL] 5)  TLB-CBC Platelet - w/Differential [85025-CBCD] 6)  TLB-Hepatic/Liver Function Pnl [80076-HEPATIC] 7)  TLB-TSH (Thyroid Stimulating Hormone) [84443-TSH] 8)  TLB-Ferritin [27253-GUY]    Current Allergies (reviewed today): PENICILLIN G POTASSIUM (PENICILLIN G POTASSIUM)

## 2010-08-08 ENCOUNTER — Encounter: Payer: Self-pay | Admitting: Internal Medicine

## 2010-08-08 DIAGNOSIS — K219 Gastro-esophageal reflux disease without esophagitis: Secondary | ICD-10-CM

## 2010-08-08 DIAGNOSIS — L301 Dyshidrosis [pompholyx]: Secondary | ICD-10-CM

## 2010-08-08 DIAGNOSIS — F32A Depression, unspecified: Secondary | ICD-10-CM

## 2010-08-08 DIAGNOSIS — I509 Heart failure, unspecified: Secondary | ICD-10-CM | POA: Insufficient documentation

## 2010-08-08 DIAGNOSIS — G479 Sleep disorder, unspecified: Secondary | ICD-10-CM

## 2010-08-08 DIAGNOSIS — M1990 Unspecified osteoarthritis, unspecified site: Secondary | ICD-10-CM

## 2010-08-08 DIAGNOSIS — F329 Major depressive disorder, single episode, unspecified: Secondary | ICD-10-CM

## 2010-08-08 DIAGNOSIS — G2581 Restless legs syndrome: Secondary | ICD-10-CM

## 2010-08-08 DIAGNOSIS — K2990 Gastroduodenitis, unspecified, without bleeding: Secondary | ICD-10-CM | POA: Insufficient documentation

## 2010-08-08 DIAGNOSIS — I2510 Atherosclerotic heart disease of native coronary artery without angina pectoris: Secondary | ICD-10-CM

## 2010-08-15 ENCOUNTER — Telehealth: Payer: Self-pay | Admitting: Family Medicine

## 2010-08-21 NOTE — Progress Notes (Signed)
Summary: refill request for mirtazapine  Phone Note Refill Request Message from:  Fax from Pharmacy  Refills Requested: Medication #1:  MIRTAZAPINE 15 MG TABS 1 tab at bedtime to help sleep and mood   Last Refilled: 07/07/2010 Faxed request from [facility] pharmacy.  [phone].  Initial call taken by: Lowella Petties CMA, AAMA,  August 15, 2010 4:54 PM    Prescriptions: MIRTAZAPINE 15 MG TABS (MIRTAZAPINE) 1 tab at bedtime to help sleep and mood  #30 x 5   Entered and Authorized by:   Ruthe Mannan MD   Signed by:   Ruthe Mannan MD on 08/16/2010   Method used:   Electronically to        [facility]* (retail)       [address]       [place], [place]  [address]       Ph: [phone]       Fax: [phone]   RxID:   [id number]

## 2010-09-07 ENCOUNTER — Other Ambulatory Visit: Payer: Self-pay | Admitting: Orthopedic Surgery

## 2010-09-07 DIAGNOSIS — M545 Low back pain: Secondary | ICD-10-CM

## 2010-09-11 ENCOUNTER — Ambulatory Visit
Admission: RE | Admit: 2010-09-11 | Discharge: 2010-09-11 | Disposition: A | Discharge status: Home / Self Care | Payer: BC Managed Care – PPO | Source: Ambulatory Visit | Attending: Orthopedic Surgery | Admitting: Orthopedic Surgery

## 2010-09-11 DIAGNOSIS — M545 Low back pain: Secondary | ICD-10-CM

## 2010-09-12 LAB — BASIC METABOLIC PANEL
BUN: 15 mg/dL (ref 6–23)
Calcium: 8.9 mg/dL (ref 8.4–10.5)
GFR calc non Af Amer: >60 mL/min (ref >60)
Glucose, Bld: 116 mg/dL — ABNORMAL HIGH (ref 70–99)

## 2010-09-13 LAB — COMPREHENSIVE METABOLIC PANEL
ALT: 18 U/L (ref 0–53)
Albumin: 3.8 g/dL (ref 3.5–5.2)
Alkaline Phosphatase: 70 U/L (ref 39–117)
GFR calc Af Amer: >60 mL/min (ref >60)
Potassium: 4.3 mEq/L (ref 3.5–5.1)
Sodium: 138 mEq/L (ref 135–145)
Total Protein: 6.6 g/dL (ref 6.0–8.3)

## 2010-09-13 LAB — DIFFERENTIAL
Basophils Relative: 1 % (ref 0–1)
Eosinophils Absolute: 0.1 10*3/uL (ref 0.0–0.7)
Monocytes Absolute: 0.5 10*3/uL (ref 0.1–1.0)
Monocytes Relative: 9 % (ref 3–12)
Neutro Abs: 3 10*3/uL (ref 1.7–7.7)

## 2010-09-13 LAB — CBC
Platelets: 214 10*3/uL (ref 150–400)
RDW: 13.3 % (ref 11.5–15.5)

## 2010-09-15 LAB — COMPREHENSIVE METABOLIC PANEL
ALT: 18 U/L (ref 0–53)
AST: 20 U/L (ref 0–37)
AST: 26 U/L (ref 0–37)
Albumin: 3.6 g/dL (ref 3.5–5.2)
Alkaline Phosphatase: 66 U/L (ref 39–117)
BUN: 13 mg/dL (ref 6–23)
CO2: 28 mEq/L (ref 19–32)
Calcium: 8.6 mg/dL (ref 8.4–10.5)
Calcium: 9.7 mg/dL (ref 8.4–10.5)
Creatinine, Ser: 1.33 mg/dL (ref 0.4–1.5)
GFR calc Af Amer: >60 mL/min (ref >60)
GFR calc Af Amer: >60 mL/min (ref >60)
GFR calc non Af Amer: 53 mL/min — ABNORMAL LOW (ref >60)
Glucose, Bld: 107 mg/dL — ABNORMAL HIGH (ref 70–99)
Glucose, Bld: 109 mg/dL — ABNORMAL HIGH (ref 70–99)
Potassium: 3.3 mEq/L — ABNORMAL LOW (ref 3.5–5.1)
Sodium: 138 mEq/L (ref 135–145)
Total Protein: 6.9 g/dL (ref 6.0–8.3)

## 2010-09-15 LAB — BASIC METABOLIC PANEL
BUN: 12 mg/dL (ref 6–23)
Chloride: 102 mEq/L (ref 96–112)
GFR calc Af Amer: >60 mL/min (ref >60)
GFR calc non Af Amer: 60 mL/min — ABNORMAL LOW (ref >60)
Potassium: 4 mEq/L (ref 3.5–5.1)
Sodium: 135 mEq/L (ref 135–145)

## 2010-09-15 LAB — DIFFERENTIAL
Lymphocytes Relative: 33 % (ref 12–46)
Lymphs Abs: 2.9 10*3/uL (ref 0.7–4.0)
Neutro Abs: 5.2 10*3/uL (ref 1.7–7.7)
Neutrophils Relative %: 57 % (ref 43–77)

## 2010-09-15 LAB — CARDIAC PANEL(CRET KIN+CKTOT+MB+TROPI)
CK, MB: 0.9 ng/mL (ref 0.3–4.0)
CK, MB: 0.9 ng/mL (ref 0.3–4.0)
Relative Index: INVALID (ref 0.0–2.5)
Total CK: 50 U/L (ref 7–232)
Total CK: 66 U/L (ref 7–232)

## 2010-09-15 LAB — LIPASE, BLOOD: Lipase: 24 U/L (ref 11–59)

## 2010-09-15 LAB — LIPID PANEL
Cholesterol: 143 mg/dL (ref 0–200)
LDL Cholesterol: 89 mg/dL (ref 0–99)
Triglycerides: 109 mg/dL (ref <150)
VLDL: 22 mg/dL (ref 0–40)

## 2010-09-15 LAB — URINE MICROSCOPIC-ADD ON

## 2010-09-15 LAB — CBC
Hemoglobin: 14.2 g/dL (ref 13.0–17.0)
MCHC: 34.2 g/dL (ref 30.0–36.0)
MCHC: 34.2 g/dL (ref 30.0–36.0)
MCV: 90.8 fL (ref 78.0–100.0)
RBC: 4.53 MIL/uL (ref 4.22–5.81)
RBC: 5.19 MIL/uL (ref 4.22–5.81)
RDW: 13.7 % (ref 11.5–15.5)

## 2010-09-15 LAB — POCT CARDIAC MARKERS
CKMB, poc: <1 ng/mL — ABNORMAL LOW (ref 1.0–8.0)
Troponin i, poc: <0.05 ng/mL (ref 0.00–0.09)
Troponin i, poc: <0.05 ng/mL (ref 0.00–0.09)

## 2010-09-15 LAB — URINALYSIS, ROUTINE W REFLEX MICROSCOPIC
Nitrite: NEGATIVE
Specific Gravity, Urine: 1.024 (ref 1.005–1.030)
Urobilinogen, UA: 0.2 mg/dL (ref 0.0–1.0)
pH: 5.5 (ref 5.0–8.0)

## 2010-09-15 LAB — APTT: aPTT: 25 seconds (ref 24–37)

## 2010-09-15 LAB — PROTIME-INR: INR: 1 (ref 0.00–1.49)

## 2010-09-15 LAB — CK TOTAL AND CKMB (NOT AT ARMC): CK, MB: 1.3 ng/mL (ref 0.3–4.0)

## 2010-09-15 LAB — HEMOGLOBIN A1C: Hgb A1c MFr Bld: 6.1 % (ref 4.6–6.1)

## 2010-09-15 LAB — TSH: TSH: 1.656 u[IU]/mL (ref 0.350–4.500)

## 2010-09-15 LAB — TROPONIN I: Troponin I: 0.03 ng/mL (ref 0.00–0.06)

## 2010-09-27 ENCOUNTER — Encounter: Payer: Self-pay | Admitting: Internal Medicine

## 2010-09-27 ENCOUNTER — Ambulatory Visit (INDEPENDENT_AMBULATORY_CARE_PROVIDER_SITE_OTHER): Payer: BC Managed Care – PPO | Admitting: Internal Medicine

## 2010-09-27 VITALS — BP 140/60 | HR 60 | Temp 98.6°F | Ht 75.0 in | Wt 232.0 lb

## 2010-09-27 DIAGNOSIS — F329 Major depressive disorder, single episode, unspecified: Secondary | ICD-10-CM

## 2010-09-27 DIAGNOSIS — G2581 Restless legs syndrome: Secondary | ICD-10-CM

## 2010-09-27 DIAGNOSIS — I5022 Chronic systolic (congestive) heart failure: Secondary | ICD-10-CM

## 2010-09-27 DIAGNOSIS — I10 Essential (primary) hypertension: Secondary | ICD-10-CM

## 2010-09-27 DIAGNOSIS — K219 Gastro-esophageal reflux disease without esophagitis: Secondary | ICD-10-CM

## 2010-09-27 NOTE — Progress Notes (Signed)
Subjective:    Patient ID: Caleb Chavez, male    DOB: [birth year], 70 y.o.   MRN: [id number]  HPI DOing okay Still not sleeping that well USes the clonazepam still--initiates okay.  Freq awakening and hard to get back to sleep Occ nocturia--that doesn't seem to be the problem  Not really depressed Feels this is better  Heartburn controlled with med Bowels are fine  Has diagnosis of CHF--when he had the gallstones No recent problems No chest pain Some SOB in bed at night--relates to increased weight Mild edema as the day goes on  Knees are still bothering him Had MRI with Dr Leslee Home Seeing Dr Ethelene Hal to consider back injections---pain seems to be from back  Current outpatient prescriptions:clonazePAM (KLONOPIN) 2 MG tablet, Take 1-2 mg by mouth at bedtime as needed.  , Disp: , Rfl: ;  furosemide (LASIX) 40 MG tablet, Take 40 mg by mouth as needed. For increased weight , Disp: , Rfl: ;  mirtazapine (REMERON) 15 MG tablet, Take 15 mg by mouth at bedtime. To help sleep and mood  , Disp: , Rfl: ;  pantoprazole (PROTONIX) 40 MG tablet, Take 40 mg by mouth daily.  , Disp: , Rfl:   Past Medical History  Diagnosis Date  . Depression   . GERD (gastroesophageal reflux disease)   . RLS (restless legs syndrome)   . Sleep disturbance     due to RLS  . Dyshidrotic eczema   . OA (osteoarthritis)   . Gastritis and duodenitis   . Heart failure     EF 40%, Echo 01-2009  . CAD (coronary artery disease)     Non obstructive (30-40% Circ, 40% RCA AM,2008)    Past Surgical History  Procedure Date  . Esophagogastric fundoplication 1996    scope-[facility]  . Kidney stone surgery 1997/2000  . Carpal tunnel release 05/2003    Right ([place])  . Ureteroscopy 05/2008    [facility]  . Cataract extraction 10/2008    Left  . Cataract extraction 11/2008    Right  . Laparoscopic cholecystectomy 04/2009    Dr. Michaell Cowing    Family History  Problem Relation Age of Onset  . Heart attack Father     4  total and bypass  . Cancer Mother     unknown type    History   Social History  . Marital Status: Divorced    Spouse Name: N/A    Number of Children: 3  . Years of Education: N/A   Occupational History  . 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 Smoking status: Never Smoker   . Smokeless tobacco: Not on file  . Alcohol Use: Yes     occasional  . Drug Use: Not on file  . Sexually Active: Not on file   Other Topics Concern  . Not on file   Social History Narrative  . No narrative on file   Review of Systems Appetite is fine Weight is fairly stable Hard to do any exercise due to knee pain    Objective:   Physical Exam  Constitutional: He appears well-developed and well-nourished. No distress.  Neck: Normal range of motion. No thyromegaly present.  Cardiovascular: Normal rate, regular rhythm, normal heart sounds and intact distal pulses.  Exam reveals no gallop.   No murmur heard. Pulmonary/Chest: Effort normal and breath sounds normal. No respiratory distress.  He has no wheezes. He has no rales.  Abdominal: Soft. He exhibits no mass. There is no tenderness.  Musculoskeletal: Normal range of motion. He exhibits no edema and no tenderness.  Lymphadenopathy:    He has no cervical adenopathy.  Psychiatric: He has a normal mood and affect. His behavior is normal. Judgment and thought content normal.          Assessment & Plan:

## 2010-10-23 NOTE — Discharge Summary (Signed)
NAMEYOSSEF, GILKISON                ACCOUNT NO.:  [id number]   MEDICAL RECORD NO.:  [id number]          PATIENT TYPE:  INP   LOCATION:  5152                         FACILITY:  [facility]   PHYSICIAN:  Willow Ora, MD           DATE OF BIRTH:  [birth year]   DATE OF ADMISSION:  01/17/2009  DATE OF DISCHARGE:  01/20/2009                               DISCHARGE SUMMARY   PRIMARY CARE PHYSICIAN:  Karie Schwalbe, MD, [facility]   BRIEF HISTORY AND PHYSICAL:  Mr. Gurry is a 70 year old gentleman with a  history of dyslipidemia nonobstructive coronary artery disease and  restless leg syndrome that was admitted with a 48-month history of nausea  and vomiting 3-4 times a week.  Prior to the admission, the symptoms got  worse and his primary care doctor ordered an ultrasound that showed  gallbladder stones.  He was supposed to see his surgeon, but as he was  getting worse, he presented to the emergency room.  In the emergency  room, he was noted to have epigastric pain.  EKG showed a new LBBB and  code STEMI was called.  Cardiology evaluated the patient, they  recommended serial cardiac enzymes, admission to the medical team, and  consult GI.  They felt that the symptoms were mostly GI related.   PHYSICAL EXAMINATION:  VITAL SIGNS:  Upon admission, temperature 98.3,  blood pressure 160/94.  LUNGS:  Clear to auscultation bilaterally.  CARDIOVASCULAR:  Regular rate and rhythm without murmur.  ABDOMEN:  Soft.  He was slightly tender in the left upper quadrant.  NEUROLOGICAL:  Intact.   LABORATORY AND X-RAYS:  Initial potassium was 3.7 with a creatinine of  1.3.  Initial white count was 9.0 with a hemoglobin of 16.1, and  platelets of 260.  Subsequent hemoglobin was 14.2.  His blood sugars has  been 126, 109, and 107.  LFTs were essentially normal.  Cardiac enzymes  were all negative.  Total cholesterol 143, LDL 89, and HDL 32.  TSH was  1.6 and hemoglobin A1c 16.1.   HOSPITAL COURSE:  The  patient was admitted to the step-down unit.  Serial enzymes were negative for any acute coronary artery syndrome.  HIDA scan was done and it was negative.  GI was consulted and they  recommended an EGD, EGD was performed on January 19, 2009, it showed  severe gastritis and duodenitis.  Biopsies to check for H. pylori are  pending at the time of this discharge, they recommended PPIs for 8  weeks, avoid NSAIDS.  They felt that surgery consult for gallbladder  stone was not recommended at this time.  Additionally, echocardiogram  was performed and it showed mild LVH changes.  The ejection fraction was  decreased to the range of 35-45% with mild diffuse hypokinesis.  Today,  on rounds, the patient feels very well.  He denies any nausea, vomiting,  or abdominal pain at present.  He has tolerated his breakfast without  problems.  Consequently, he has reached maximal hospital benefit.  I  spoke with Cardiology today to be  sure that he will have a follow up at  the Cardiology office at some point in the near future.   He will be discharged with the following instructions.  1. Continue with bupropion 150 one p.o. b.i.d.  2. Clonazepam 2 mg p.o. at bedtime.  3. Mirapex 0.25 mg p.o. at bedtime.  4. At this point, we will also continue with aspirin 81 mg daily,      despite the fact that he has gastritis since he is feeling better      with PPIs.  5. Protonix 40 mg twice a day before meals.  6. The patient is advised to use Tylenol if needed, but avoid Motrin      or Advil.  7. Advised to see Dr. Alphonsus Sias, his primary care doctor in 10-12 days.  8. He will see Cardiology as an outpatient for further evaluation of      the new LBBB and CHF.  9. He is advised to come to the hospital or call his primary doctor if      his symptoms resurface.   ADMITTING DIAGNOSES:  1. Epigastric pain.  2. New left bundle-branch block.   DISCHARGE DIAGNOSES:  1. Severe gastritis and duodenitis.  2. New left  bundle-branch block.  3. Congestive heart failure per recent echo, needs further evaluation      as an outpatient.  4. Diabetes with a hemoglobin A1c of 6.1.  5. Cholelithiasis with negative HIDA scan.  Further evaluation of      these per primary care doctor.  6. Anxiety and depression.  7. Gastroesophageal reflux disease.  8. Restless leg syndrome.  9. Dyslipidemia.  10.History of nonobstructive coronary artery disease.      Willow Ora, MD  Electronically Signed     JP/MEDQ  D:  01/20/2009  T:  01/20/2009  Job:  [id number]

## 2010-10-23 NOTE — Assessment & Plan Note (Signed)
[facility]                            CARDIOLOGY OFFICE NOTE   Caleb Chavez, Caleb Chavez                       MRN:          [id number]  DATE:10/30/2006                            DOB:          May 22, 1941    REASON FOR CONSULTATION:  Patient with dyspnea and a markedly abnormal  EKG.   HISTORY OF PRESENT ILLNESS:  The patient is a very pleasant 70 year old  gentleman without prior cardiac history. He saw Dr. Alphonsus Sias 2 days ago  because of problems with his restless legs. Upon further questioning, he  was noted to have a definite change in his functional capacity over the  past couple of months. He has noticed increasing dyspnea with mild to  moderate activity such as walking 50 yards on level ground or picking up  a package. This was clearly a change from this time last year. He is not  having any resting shortness of breath and was denying PND or orthopnea.  He has had no swelling. He has had his usual winter weight gain but  nothing abrupt. He has also noticed much more fatigue with his daily  activities. He has had some chest discomfort. However, these have been  somewhat atypical. He gets these sporadically. He describes some chest  burning that is very brief. He may have some discomfort in his left  upper arm. However, he does not associate this with activity. He does  not associate this with his other symptoms and does not get nausea,  vomiting, diaphoresis. He has had palpitations, but these have been for  years. He has had no presyncope or syncope. He saw Dr. Alphonsus Sias and with  this story had an EKG. He had deep T-wave inversions in leads V1 through  V5. He has a subtle T-wave inversion in lead III. This is new compared  to his most recent EKG in 2003.   PAST MEDICAL HISTORY:  1. Borderline hypertension.  2. Restless leg syndrome.  3. Depression.  4. Gastroesophageal reflux disease.  5. Eczema.   PAST SURGICAL HISTORY:  1. Fundoplication.  2. Knee surgery.  3. Hand surgery.  4. Foot surgery.   ALLERGIES:  PENICILLIN.   MEDICATIONS:  1. __________  .  2. Levodopa/carbidopa.   SOCIAL HISTORY:  The patient is married. 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 He has never smoked cigarettes.   FAMILY HISTORY:  Is contributory for his father having early heart  disease starting in his 78s with myocardial infarctions. He died in his  mid 95s with coronary disease.   REVIEW OF SYSTEMS:  As stated in the HPI and negative for all other  symptoms.   PHYSICAL EXAMINATION:  The patient is in no distress. He is pleasant.  Weight 228 pounds, blood pressure 140/86, heart rate 60 and regular,  body mass index 29.  HEENT:  Eyelids unremarkable; pupils are equal, round, and reactive to  light; fundi not visualized. Oral mucosal unremarkable.  NECK:  No jugular venous distention. Wave form within normal limits.  Carotid upstrokes brisk and symmetrical. No bruits. No thyromegaly.  LYMPHATICS:  No cervical, axillary, or inguinal adenopathy.  LUNGS:  Clear to auscultation bilaterally.  BACK:  No costovertebral angle tenderness.  CHEST:  Unremarkable.  HEART:  PMI not displaced or sustained. S1 and S2 within normal limits.  No S3, no s4, no clicks, no rubs, no murmurs.  ABDOMEN:  Obese. Positive bowel sounds, normal in frequency and pitch,  no bruits, no rebound, no guarding, no midline pulsatile mass, no  hepatomegaly, no splenomegaly.  SKIN:  No rashes, no nodules.  EXTREMITIES:  2+ pulses throughout, no edema, no cyanosis, no clubbing.  NEUROLOGICAL:  Oriented to person, place, and time; cranial nerves II-  XII grossly intact; motor grossly intact.   Echocardiogram:  I did a quick-look echocardiogram in the office today.  He has a well-preserved ejection fraction.   EKG:  Sinus rhythm, rate 60, left axis deviation, early transition lead  V2, deep T wave inversions in V1 through V5  consistent with ischemia  and new from  previous.   ASSESSMENT AND PLAN:  1. Abnormal EKG/dyspnea. The patient's symptoms and his EKG as well as      his family history are very concerning for this being high-grade      obstructive coronary lesion. I think the pretest probability of      this is high. I did discuss this at length with the patient and his      wife. Given this situation, I would favor cardiac catheterization      as the initial diagnostic test. I have discussed the risks      including stroke, heart attack, death, vascular trauma, bleeding,      bruising, infection, renal insufficiency, dye allergy, embolism.      They underwent and agree to proceed. We will do this tomorrow.  2. Risk reduction. Will follow up with his lipid profile which we can      get at Dr. Karle Starch office. I      will give him an aspirin today. I will start him on a very low-dose      beta blocker.  3. Obesity. We will discuss weight loss with diet and exercise over      time.     Rollene Rotunda, MD, [facility]  Electronically Signed    JH/MedQ  DD: 10/30/2006  DT: 10/30/2006  Job #: [id number]   cc:   Karie Schwalbe, MD

## 2010-10-23 NOTE — Discharge Summary (Signed)
NAMEJHOAN, SCHMIEDER                ACCOUNT NO.:  [id number]   MEDICAL RECORD NO.:  [id number]          PATIENT TYPE:  INP   LOCATION:  6526                         FACILITY:  [facility]   PHYSICIAN:  Rollene Rotunda, MD, FACCDATE OF BIRTH:  02/19/1941   DATE OF ADMISSION:  12/07/2007  DATE OF DISCHARGE:  12/08/2007                               DISCHARGE SUMMARY   PROCEDURES:  None.   PRIMARY FINAL DISCHARGE DIAGNOSES:  Chest pain, cardiac enzymes negative  for myocardial infarct, and outpatient Myoview planned.   SECONDARY DIAGNOSES:  1. Dyslipidemia with a total cholesterol of 151, triglycerides 112,      HDL 31, LDL 98 this admission.  2. Mildly abnormal chest x-ray with likely atelectasis on the right      and atelectasis versus infiltrate on the left, follow up on p.r.n.      basis.  3. History of nonobstructive coronary artery disease.  4. Restless leg syndrome.  5. Gastric esophageal reflux disease, status post Nissen      fundoplication.  6. Reported history of depression.  7. Allergy or intolerance to penicillin.   FAMILY HISTORY:  Premature coronary artery disease in his father.   TIME OF DISCHARGE:  33 minutes.   HOSPITAL COURSE:  Mr. Goeden is a 70 year old male with a history of  nonobstructive coronary artery disease.  He had intermittent chest pain  which started at rest and lasted 3-4 hours.  His cath was for similar  symptoms in 2008, and he did not require intervention.  He also  complained of some weakness.  He was admitted for further evaluation.   His cardiac enzymes were negative for MI.  A D-dimer was within normal  limits at 0.44.  He had no significant abnormalities on his other labs.  His chest x-ray was mildly abnormal with consideration given to  infiltrate in the left base; however, he was afebrile, white count was  within normal limits, and he had no significant cough.   On physical exam, no pneumonia was heard.  Chest,  some crackles that  cleared  with cough.   Dr. Antoine Poche evaluated Mr. Arizpe and felt that he could be safely  discharged home since he had ruled out for an MI.  Dr. Antoine Poche felt  that he was stable for outpatient follow up.   DISCHARGE INSTRUCTIONS:  His activity level is to be increased  gradually.  He is to follow up at our office with a stress test on December 16, 2007 and with Dr. Antoine Poche in August.  He is to follow up with Dr.  Alphonsus Sias as needed.   DISCHARGE MEDICATIONS:  1. Clonazepam 2 mg nightly.  2. Mirapex 0.25 mg nightly.  3. Aspirin 81 mg q. day.      Theodore Demark, PA-C      Rollene Rotunda, MD, [facility]     RB/MEDQ  D:  12/08/2007  T:  12/09/2007  Job:  [id number]   cc:   Karie Schwalbe, MD

## 2010-10-23 NOTE — Op Note (Signed)
Caleb Chavez, Caleb Chavez                ACCOUNT NO.:  [id number]   MEDICAL RECORD NO.:  [id number]          PATIENT TYPE:  AMB   LOCATION:  [facility]                         FACILITY:  [facility]   PHYSICIAN:  Mark C. Vernie Ammons, M.D.  DATE OF BIRTH:  31-Aug-1940   DATE OF PROCEDURE:  03/29/2008  DATE OF DISCHARGE:                               OPERATIVE REPORT   PREOPERATIVE DIAGNOSIS:  Left ureteral calculus.   POSTOPERATIVE DIAGNOSIS:  Left ureteral calculus.   OPERATION PERFORMED:  1. Cystoscopy.  2. Right ureteral catheterization.  3. Right retrograde pyelogram with interpretation.  4. Right ureteroscopy.  5. In situ laser lithotripsy.  6. Stone extraction.  7. Double-J stent placement.   SURGEON:  Mark C. Vernie Ammons, M.D.   ASSISTANT:  Delman Kitten, M.D.   ANESTHESIA:  General.   SPECIMENS:  Stone to patient.   DRAINS:  6 French 28 cm double-J stent in the right ureter (no strings).   ESTIMATED BLOOD LOSS:  Minimal.   COMPLICATIONS:  None.   INDICATIONS FOR PROCEDURE:  The patient is a 70 year old white male who  presented to my office this morning in severe pain.  His pain was not  controlled well with parenteral narcotic analgesics and a CT scan done  24 hours previously revealed an 8.5 mm stone in his mid right ureter.  A  KUB revealed the stone was difficult to see due to overlying bowel gas.  I discussed treatment with the patient and he understands and has  elected to proceed with the above noted operation.   DESCRIPTION OF PROCEDURE:  After informed consent the patient went to  the major operating room and was placed on the table, administered  general anesthesia and then moved to the dorsal lithotomy position.  His  genitalia were sterilely prepped and draped and an official time out was  then performed.  Initially a 22 French cystoscope was then passed per  urethra under direct visualization.  The urethra was noted to be normal.  The prostatic urethra revealed elongation  and bilobar hypertrophy.  Upon  entering the bladder, the bladder was fully inspected and noted to be  free of any tumor, stones or inflammatory lesions.   The right ureteral orifice was identified and a 6 [place] open ended  ureteral catheter was then passed through the cystoscope and into the  right orifice in preparation for a right retrograde pyelogram.   The right retrograde pyelogram was performed by injecting full strength  contrast under direct fluoroscopy up the right ureter and this revealed  a normal ureter without filling defects until the contrast reached the  midureter where a filling defect consistent with the stone was  identified.  There was some mild proximal hydronephrosis but no filling  defects or abnormalities proximally.   A 0.038 inch floppy tip guidewire was then passed through the open ended  catheter and up the right ureter under direct fluoroscopy into the area  of the renal pelvis.  With this in place, the 6 French rigid  ureteroscope was then passed under direct visualization into the bladder  and into  the right orifice.  As I attempted to pass the scope up the  ureter, I realized that the angle made it difficult to see the lumen  adequately and therefore, felt this was not safe to continue and removed  the ureteroscope leaving the guidewire in place.  A second guidewire was  then passed up the right ureter under direct fluoroscopy and over this  working guidewire, the inner portion of the ureteral access sheath was  then passed over the guidewire and any outer portion was passed with the  ureteral sheath assembled.  I then removed the inner portion and used  the access sheath for access with the flexible ureteroscope.   The 6 French flexible ureteroscope was then passed up the right ureter  using the access sheath and the stone was identified.  There was mild  edema where the stone had been lodged and the stone was then nudged  proximally into the ureter  in preparation for laser lithotripsy.   The 200 micron laser fiber was then passed through the ureteroscope and  this was used to fragment the stone.  The stone fragmented slowly but it  was efficiently fragmented.  A single large fragment was grasped with  the nitinol basket and extracted without difficulty to be sent for  analysis.  The access sheath was then removed with the safety guidewire  still in place and the safety guidewire was backloaded on the  cystoscope.  The double-J stent was then passed over the guidewire into  the area of the renal pelvis and as the guidewire was removed, good curl  was noted in the area of the renal pelvis as well as in the bladder.  The bladder was then drained, the patient received a B and O suppository  and was awakened and taken to recovery room in stable satisfactory  condition.  The patient tolerated the procedure well.  There were no  intraoperative complications.   He will be given a prescription for Flomax upon discharge.  He has a  prescription for hydrocodone should that be necessary and he will follow  up in my office in 5 to 7 days for his stent removal cystoscopically.      Mark C. Vernie Ammons, M.D.  Electronically Signed     MCO/MEDQ  D:  03/29/2008  T:  03/29/2008  Job:  [id number]

## 2010-10-23 NOTE — Assessment & Plan Note (Signed)
[facility]                             PULMONARY OFFICE NOTE   Caleb Chavez, Caleb Chavez                       MRN:          [id number]  DATE:01/05/2007                            DOB:          1940-07-23    HISTORY OF PRESENT ILLNESS:  Patient is a 70 year old white male who I  had seen for restless leg syndrome.  At the last visit I changed him  from carbidopa/levodopa to Mirapex to see if he would be able to  tolerate this.  Patient states he is much improved since being on this.  He is sleeping better and has increased energy during the day.  He is  not totally asymptomatic.  He has been tolerating this dose with no  difficulties.   PHYSICAL EXAMINATION:  GENERAL:  He is an overweight male in no acute  distress.  Blood pressure 140/88, pulse 58, temperature is 97.9, weight  is 234 pounds, O2 saturation on room air is 98%.   IMPRESSION:  Severe restless leg syndrome which has improved since being  on Mirapex.  He is on a fairly low dose and I would like to try him at a  higher strength to see if this will make him totally asymptomatic.  We  will certainly have to be careful of inducing nausea at the higher dose  since he has shown intolerance to Requip.   PLAN:  1. We will increase Mirapex to 0.25 mg daily as a trial.  He is to      call me if he has any nausea with this.  2. Would consider tapering down or off of clonazepam if it is being      used for his restless leg over time, if the Mirapex works well.  I      will leave that to his primary care physician.  3. The patient will follow up in 12 months or sooner if there is      problems.     Barbaraann Share, MD,FCCP  Electronically Signed    KMC/MedQ  DD: 01/05/2007  DT: 01/05/2007  Job #: [id number]   cc:   Karie Schwalbe, MD

## 2010-10-23 NOTE — H&P (Signed)
NAMEREYAN, Caleb Chavez                ACCOUNT NO.:  [id number]   MEDICAL RECORD NO.:  [id number]          PATIENT TYPE:  EMS   LOCATION:  [facility]                         FACILITY:  [facility]   PHYSICIAN:  Audery Amel, MD    DATE OF BIRTH:  [birth year]   DATE OF ADMISSION:  12/07/2007  DATE OF DISCHARGE:                              HISTORY & PHYSICAL   PRIMARY CARDIOLOGIST:  Rollene Rotunda, MD, [facility].   CHIEF COMPLAINT:  Chest pain.   HISTORY OF PRESENT ILLNESS:  The patient is a 70 year old white male  with history of nonobstructive CAD, presenting to the emergency  department for further evaluation of chest pain.  The patient states  that this began approximately 6 p.m. this evening while at rest watching  the news.  He characterized the onset of chest pressure in the  substernal region radiating to the right neck.  Symptoms were associated  with shortness of breath and palpitations.  He stated the symptoms last  approximately 3-4 hours.  At its worst, he rated his pain 5 out of 10.  Currently is 3 out of 10.  On arrival to the emergency department, the  patient was hemodynamically stable.  EKG was obtained which revealed no  acute injury or ischemia.  His initial cardiac biomarkers by point of  care testing were negative for injury.  He continued to have 3 out of 10  chest discomfort and, therefore, he is being admitted for further  evaluation.   PAST MEDICAL HISTORY:  1. Nonobstructive CAD.  2. Restless leg syndrome.  3. GERD, status post Nissen fundoplication.  4. History of depression.   ALLERGIES:  PENICILLIN.   CURRENT MEDICATIONS:  1. Clonazepam 2 mg at bedtime.  2. Mirapex 0.25 mg daily  3. Aspirin 81 mg daily   SOCIAL HISTORY:  The patient lives [place] alone.  He works as a  Facilities manager.  He denies any tobacco.  He endorses occasional alcohol  use and denies illicit substance.   FAMILY HISTORY:  There is early history of coronary artery disease in  his father  diagnosed at age of 45.   REVIEW OF SYSTEMS:  As per HPI, otherwise complete review of systems was  obtained and negative as documented.   PHYSICAL EXAMINATION:  VITAL SIGNS:  Blood pressure is 126/77, heart  rate is 47, oxygen saturation 94% on 2 L nasal cannula.  GENERAL:  The patient is alert, oriented x3 in no acute distress.  HEENT:  Normocephalic, atraumatic.  EOMI.  PERRL.  Nares patent.  Throat  is clear without erythema or exudate.  NECK:  Supple, full range of motion, no appreciable JVD.  No palpable  thyromegaly or lymphadenopathy.  CARDIOVASCULAR:  Bradycardia.  S1 and S2 with no audible murmurs, rubs  or gallops.  PMI is nondisplaced in left midclavicular line.  Peripheral  pulses are 2+ and symmetric bilaterally.  LUNGS:  Clear to auscultation bilaterally.  ABDOMEN:  Soft, nontender.  Bowel sounds are normal.  No  hepatosplenomegaly.  EXTREMITIES:  Reveal no clubbing, cyanosis or edema.  MUSCULOSKELETAL:  No joint deformity  or effusions.  NEUROLOGIC:  Grossly nonfocal.   EKG shows normal sinus rhythm with a slight left axis deviation.  There  is borderline criteria for LVH.   LABORATORY DATA:  Sodium 142, potassium 3.3, chloride 108, BUN is 16,  creatinine 1.2, glucose 105.  CK-MB 1.3 by point of care, troponin-I  less than 0.05 by point of care.   IMPRESSION:  1. Chest pain, rule out myocardial infarction.  2. No stroke history or nonobstructive coronary artery disease.  3. Restless leg syndrome.   PLAN:  Will admit the patient to a telemetry bed for further monitoring  evaluation.  Will cycle his cardiac biomarkers to rule out myocardial  infarction.  My suspicion for an acute coronary syndrome is low.  We  will continue with his daily aspirin therapy.  Currently the patient's  heart rate is 47, although this was while he was sleeping.  We will  withhold any beta blocker therapy at this point given his  bradycardia.  Will check fasting lipid profile in a.m. to  consider statin.  If the  patient rules out for myocardial infarction, would consider noninvasive  risk stratification as an outpatient.      Audery Amel, MD  Electronically Signed     SHG/MEDQ  D:  12/08/2007  T:  12/08/2007  Job:  [id number]

## 2010-10-23 NOTE — Assessment & Plan Note (Signed)
[facility]                             PULMONARY OFFICE NOTE   Caleb Chavez, Caleb Chavez                       MRN:          [id number]  DATE:12/02/2006                            DOB:          13-Dec-1940    HISTORY OF PRESENT ILLNESS:  Patient is a very pleasant 70 year old  gentleman who I have been asked to see for fragmented sleep. The patient  has been diagnosed with the restless leg syndrome and had a sleep study  approximately 1 and a half years ago that documented the large numbers  of leg jerks, but he did not have any sleep apnea at that time. Patient  typically goes to bed between 9:30 p.m. and 10:30 p.m., and gets up at  6:00 a.m. to start his day. He is not rested upon arising. He wakes up  multiple times during the night. He states that most of the time it will  take him 30 to 40 minutes before he is able to get back to sleep. This  is usually because his legs are bothering him significantly and he will  typically get up and do exercises in order to make them better. The  patient currently is on clonazepam, and carbidopa-levodopa for these leg  jerks, and certainly has been helped by the medications, but he  continues to have symptoms. During the day the patient does note some  fatigue but no significant inappropriate daytime sleepiness. He has some  sleepiness in the late afternoon watching the news, but does not nap  during the day, and does not feel like his quality of life is being  overly effected. Patient drinks less than 2 cups of coffee a day and  caffeine free sodas. Of note the patient has been tried on Requip which  resulted in severe nausea for him but has not tried Mirapex.   PAST MEDICAL HISTORY:  Significant for;  1. Dyslipidemia.  2. History of kidney stones.  3. History of borderline hypertension.  4. History of reflux disease.   CURRENT MEDICATIONS:  Include;  1. Clonazepam 2 mg one half to one at bedtime.  2.  Carbidopa-levodopa 50/200 one at bedtime.  3. Aspirin 81 mg daily.   PATIENT IS ALLERGIC TO PENICILLIN.   SOCIAL HISTORY:  He is married, has children. He has never smoked.   FAMILY HISTORY:  Remarkable for his mother having cancer of unknown  type. His dad did have a myocardial infarction.   REVIEW OF SYSTEMS:  As per history of present illness. Also see patient  intake form documented on the chart.   PHYSICAL EXAMINATION:  IN GENERAL: He is an overweight male in no acute  distress. Blood pressure is 110/76, pulse 59, temperature is 98.2,  weight is 233 pounds, O2 saturation on room air is 95%.  HEENT: Pupils equal, round, and reactive to light, and accommodation.  Extraocular muscles are intact. Nares are patent without discharge.  Oropharynx does show mild elongation of the soft palate and uvula.  NECK: Supple without JVD or lymphadenopathy. No palpable thyromegaly.  CHEST: Totally clear.  CARDIAC EXAM:  Reveals regular rate and rhythm. No murmurs, rubs, or  gallops.  ABDOMEN: Soft, nontender with good bowel sounds.  GENITAL EXAM, RECTAL EXAM, BREAST EXAM: Was not done and not indicated.  LOWER EXTREMITIES: Show varicosities with no edema.  NEUROLOGICALLY: Alert and oriented with no obvious motor deficits.   IMPRESSION:  History consistent with severe restless leg syndrome. He  had difficulties with nausea from Requip, and is currently on carbidopa  and clonazepam with improvement, but still having significant symptoms.  At this point in time I would like to try Mirapex and also check an iron  panel to make sure this is not driving his restless legs.   PLAN:  1. Mirapex 0.125 at bedtime as a trial. He is to take this on a full      stomach.  2. Stop carbidopa and levodopa during this time.  3. Stay on Klonopin for now.  4. We will check iron studies for completeness.  5. The patient will follow up in 4 weeks, sooner if there are      problems.     Barbaraann Share,  MD,FCCP  Electronically Signed    KMC/MedQ  DD: 01/05/2007  DT: 01/05/2007  Job #: [id number]   cc:   Karie Schwalbe, MD

## 2010-10-23 NOTE — H&P (Signed)
NAMEKARRIEM, MUENCH                ACCOUNT NO.:  [id number]   MEDICAL RECORD NO.:  [id number]          PATIENT TYPE:  EMS   LOCATION:  [facility]                         FACILITY:  [facility]   PHYSICIAN:  Michiel Cowboy, MDDATE OF BIRTH:  01/30/1941   DATE OF ADMISSION:  01/17/2009  DATE OF DISCHARGE:                              HISTORY & PHYSICAL   PRIMARY CARE Alvaro Aungst:  Karie Schwalbe, M.D.   Patient is a pleasant 70 year old gentleman with past medical history  significant for dyslipidemia, nonobstructive coronary artery disease as  well as restless legs syndrome.  The patient reports that for the past  for 5 months or so, he had been having nausea and vomiting about 3 to 4  times a week in the morning, which progressed to occasional episode of  central epigastric as well as left upper quadrant pain which would come  and go and was bothering him for the past 1 month or so.  He did not  notice if this was associated with eating or not.  He did notice that  occasionally he would have loose bowel movements whenever he will try to  eat.  He did notice that it was worse if he would try to eat anything  fattening or not.  For the past 4 days, the pain has become continuous  and was more severe in strength.  Eventually, he presented to his  primary care Afra Tricarico, who obtained right upper quadrant scan which  showed gallbladder stones.  He was supposed to see a surgeon tomorrow,  but the pain became worse, at which point he called his primary care  Etola Mull, who told him to just come to the emergency department.  In the  emergency department, an EKG was obtained as part of his workup, which  showed left bundle branch block, which was new.  A codes STIMI was  called.  Cardiology has seen the patient.  He was initially heparinized  once his cardiac markers repeatedly came back negative.  This was  discontinued.  He never endorsed any chest pain or shortness of breath.  No diaphoresis.  The  pain has been there fairly continuously for the  past 4 days or so.  A CT scan of his abdomen and pelvis was done but  again showed only gallbladder stones but no cholecystitis.  There is  some mild hepatic steatosis, at which point, the hospitalist was called  to admit the patient.   REVIEW OF SYSTEMS:  No fevers, no chills.  Only significant for nausea  and vomiting, occasional diarrhea, and otherwise unremarkable.  The  patient has occasional symptoms which would be consistent of PND  possibly.   PAST MEDICAL HISTORY:  1. Significant for anxiety.  2. Depression.  3. GERD, status post Nissen fundoplication by [facility].  4. Restless leg syndrome.  5. History of nonobstructive coronary artery disease.  6. Dyslipidemia.   SOCIAL HISTORY:  Does not smoke or drink, does not abuse drugs.  Lives  at home, has a very supportive family.   FAMILY HISTORY:  Significant for father with coronary artery disease  in  his 25s.   ALLERGIES:  PENICILLIN.   MEDICATIONS:  1. __________ 0.25 q.h.s.  2. Bupropion 150 mg twice a day.  3. Klonopin 2 mg at night.  4. Aspirin 81 mg daily.   PHYSICAL EXAMINATION:  VITALS:  Temperature 98.3, blood pressure 160/94,  pulse 73, respiratory rate 18, satting 95% on room air.  The patient appears to be in no acute distress.  Head nontraumatic.  Moist mucous membranes.  LUNGS:  Clear to auscultation bilaterally.  HEART: Regular rate and rhythm.  No murmurs appreciated.  ABDOMEN: Soft, nontender, nondistended.  No right upper quadrant  tenderness with significant left upper quadrant tenderness.  Lower  extremity without clubbing, cyanosis or edema.  NEUROLOGIC: Intact.   LABS:  White blood cell count 9.0, hemoglobin 16.1.  Sodium 140,  potassium 3.7, creatinine 1.33, total bilirubin 1.5, total protein 8.4,  albumin 4.4.  Cardiac enzymes x3 negative.  UA showing 3 to 6 white  blood cells and rare bacteria.   EKG showing left bundle branch block, which is  new.   CT scan of the abdomen showing cholelithiasis, hepatic steatosis but no  evidence for cholecystitis.   Right upper quadrant ultrasound:  No evidence of cholecystitis.   ASSESSMENT/PLAN:  This is a 70 year old gentleman with history of  gallbladder stones here with abdominal pain, abnormal EKG.  1. Abnormal electrocardiogram:  Per recommendation of cardiology, will      put in step-down, cardiac monitoring, cycle cardiac markers.      Recommendations at this point, will stop heparin.  Order 2-D echo.      Check TSH, fasting lipid panel hemoglobin A1C.  Appreciate      cardiology input.  2. Abdominal pain.  Etiology at this point not quite sure, since this      is more on the left than on the right.  No evidence of      pancreatitis.  Would attempt HIDA scan to see if maybe there is      some gallbladder dysfunction.  Will also consider GI consult.  Will      start on pain medications as well as Zofran.  Given epigastric      pain, maybe the patient is having more of a peptic ulcer disease or      esophagitis.  May benefit from endoscopy and some point will      Hemoccult stools.  Give Protonix b.i.d.  3. Anxiety.  Continue Klonopin.  4. Prophylaxis:  Protonix plus Lovenox.  The patient has no evidence      of bleeding.      Michiel Cowboy, MD  Electronically Signed     AVD/MEDQ  D:  01/18/2009  T:  01/18/2009  Job:  [id number]   cc:   Karie Schwalbe, MD

## 2010-10-23 NOTE — Cardiovascular Report (Signed)
NAMEBOYKIN, BAETZ                ACCOUNT NO.:  [id number]   MEDICAL RECORD NO.:  [id number]          PATIENT TYPE:  OIB   LOCATION:  1967                         FACILITY:  [facility]   PHYSICIAN:  Veverly Fells. Excell Seltzer, MD  DATE OF BIRTH:  [birth year]   DATE OF PROCEDURE:  10/31/2006  DATE OF DISCHARGE:                            CARDIAC CATHETERIZATION   PROCEDURE:  Left heart catheterization, selective coronary angiography,  left ventricular angiography.   INDICATIONS:  Caleb Chavez is a 70 year old gentleman with no prior cardiac  history.  He is has had progressive chest discomfort, dyspnea and  fatigue over the last few months.  His EKG shows deep T-wave inversions  throughout his precordial leads.  He has multiple risk factors for  cardiac disease.  In the setting of his typical history with an abnormal  EKG and multiple risk factors.  He was referred directly for cardiac  catheterization.   Risks and indications of procedure were explained to the patient.  Informed consent was obtained.  The right groin was prepped, draped and  anesthetized with 1% lidocaine.  Using modified Seldinger technique, a 4-  French sheath was placed in the right femoral artery.  Multiple views of  the left and right coronary arteries were taken using standard 4-French  catheters.  Following coronary angiography, an angled pigtail catheter  was inserted into the left ventricle and pressures were recorded.  The  left ventriculogram was performed.  A pullback across the aortic valve  was done.  All catheter exchanges were performed over a guidewire.  There were no immediate complications.   FINDINGS:  Aortic pressure 150/73 with a mean of 103, left ventricular  pressure 145/12.   The left mainstem is angiographically normal.  It bifurcates into the  LAD and left circumflex.  The LAD is a large-caliber vessel that courses  down and wraps around the left ventricular apex.  It provides three  diagonal branches  from its proximal to mid portion.  The first diagonal  branch is the largest of the three diagonals.  The second third diagonal  are medium size.  There is no significant angiographic disease seen  throughout the LAD or any of the diagonal branches.   The left circumflex is a medium-size vessel.  It courses down the AV  groove and provides an intermediate branch from its most proximal  portion.  The intermediate branch has no significant disease.  The AV  groove circumflex has serial 30-40% stenosis and then gives off two  posterolateral branches.  There is no high-grade stenosis in the left  circumflex system.   The right coronary artery is dominant.  It provides a large acute  marginal branch from its mid portion.  The proximal portion of the  vessel has diffuse nonobstructive plaque.  The mid portion at the origin  of the acute marginal branch has a 40% stenosis.  The distal right  coronary artery has nonobstructive plaque and it then terminates in a  PDA branch and posterior AV segment.  The PDA branch is medium size and  has diffuse nonobstructive plaque.  Left ventriculography performed in the 30 degrees RAO projection is  difficult to interpret because of ventricular ectopy.  There does not  appear to be any regional wall motion abnormality, although, the  anteroapex is a little bit difficult to assess.  Overall, I would  estimate the left ventricular ejection fraction at 55%.   ASSESSMENT:  1. Nonobstructive coronary artery disease involving the left      circumflex and right coronary artery.  2. Suspected normal left ventricular systolic function.   PLAN:  Would recommend medical therapy for [name].  He does have  nonobstructive CAD and should have aggressive control of his  hypertension and dyslipidemia as well as antiplatelet therapy with  aspirin.  If accurate assessment of his left ventricular function is  critical, he should undergo another imaging study such as  an  echocardiogram.  Overall, his LV systolic function appears preserved.      Veverly Fells. Excell Seltzer, MD  Electronically Signed     MDC/MEDQ  D:  10/31/2006  T:  10/31/2006  Job:  [id number]   cc:   Rollene Rotunda, MD, [facility]  Karie Schwalbe, MD

## 2010-10-23 NOTE — Assessment & Plan Note (Signed)
[facility]                            CARDIOLOGY OFFICE NOTE   Caleb Chavez, Caleb Chavez                       MRN:          [id number]  DATE:11/10/2006                            DOB:          11/29/1940    PRIMARY CARE PHYSICIAN:  Dr. [name].   REASON FOR PRESENTATION:  Evaluate patient with abnormal EKG, status  post cardiac catheterization.   HISTORY OF PRESENT ILLNESS:  The patient is a pleasant 70 year old  gentleman who presented with dyspnea and fatigue.  He had a markedly  abnormal EKG and cardiovascular risk factors.  Therefore, he underwent  cardiac catheterization.  This demonstrated 30-40% circumflex stenosis  as well as a 40% acute marginal stenosis.  Otherwise he had no plaque  and a well preserved ejection fraction.   He returns for followup.  He has had no new symptoms.  He has had no new  chest discomfort.  He remains fatigued.  He remains dyspneic without  clear etiology.  He is not having any resting shortness of breath and  has no PND or orthopnea.  He has no palpitations, presyncope or syncope.  He has had no problems with his leg at the catheterization site.   PAST MEDICAL HISTORY:  1. Newly diagnosed nonobstructive coronary artery disease.  2. Borderline hypertension.  3. Obesity.  4. Restless leg syndrome.  5. Depression.  6. Gastroesophageal reflux disease.  7. Eczema.  8. Fundoplication.  9. Knee surgery.  10.Hand surgery.  11.Foot surgery.   ALLERGIES:  PENICILLIN.   MEDICATIONS:  1. Levadopa.  2. Carbidopa.  3. Clonazepam.   REVIEW OF SYSTEMS:  As stated in the HPI and otherwise negative for all  other systems.   PHYSICAL EXAMINATION:  The patient is in no distress.  Weight 238  pounds, blood pressure 140/82, heart rate 64 and regular.  HEENT:  Eyelids unremarkable, pupils equally round and reactive to  light, fundi not visualized, oral mucosa unremarkable.  NECK:  No jugular venous distention at,  wave form within normal limits,  carotid upstroke brisk and symmetrical, no bruits, no thyromegaly.  LYMPHATICS:  No cervical, axillary, inguinal adenopathy.  LUNGS:  Clear to auscultation bilaterally.  BACK:  No costovertebral angle tenderness.  CHEST:  Unremarkable.  HEART:  PMI not displaced or sustained, S1 and S2 within normal limits,  no S3, no S4, no clicks, no rubs, no murmurs.  ABDOMEN:  Obese, positive bowel sounds normal in frequency and pitch, no  bruits, no rebound, no guarding, no midline pulsatile mass, no  hepatomegaly, splenomegaly.  SKIN:  No rashes, no nodules.  EXTREMITIES:  With 2+ pulses throughout, no edema, no cyanosis, no  clubbing.  NEURO:  Oriented to person, place, and time; cranial nerves II-XII  grossly intact, motor grossly intact throughout.   ASSESSMENT/PLAN:  1. Chest discomfort.  The patient's chest discomfort does not have an      anginal etiology.  At this point I am not clear on the etiology but      will refer him back to Dr. Alphonsus Sias for further evaluation.  2. Nonobstructive plaque.  The patient does have nonobstructive      plaque.  We spent quite a bit of time discussing this and risk      modification.  At this point, no further cardiovascular testing is      suggested.  He will be going back to Dr. Alphonsus Sias for review of his      lipid profile.  I called Dr. Alphonsus Sias today and suggested a low      threshold for a statin (perhaps if his LDL is greater than 100).  I      will defer management of this.  3. Obesity.  We discussed the need to lose weight with diet and      exercise.  4. Hypertension.  Blood pressure is borderline.  I think that this      would come down with weight loss.  5. Followup will be as needed.  Greater than one half hour with this      appointment.     Rollene Rotunda, MD, [facility]  Electronically Signed    JH/MedQ  DD: 11/10/2006  DT: 11/10/2006  Job #: [id number]   cc:   Karie Schwalbe, MD

## 2010-10-23 NOTE — Consult Note (Signed)
NAMEMATTIS, Caleb Chavez                ACCOUNT NO.:  [id number]   MEDICAL RECORD NO.:  [id number]          PATIENT TYPE:  INP   LOCATION:  3315                         FACILITY:  [facility]   PHYSICIAN:  Arturo Morton. Riley Kill, MD, FACCDATE OF BIRTH:  June 01, 1941   DATE OF CONSULTATION:  01/17/2009  DATE OF DISCHARGE:                                 CONSULTATION   PRIMARY CARDIOLOGIST:  Rollene Rotunda, MD, [facility].   PRIMARY CARE PHYSICIAN:  Karie Schwalbe, MD.   REASON FOR CONSULTATION:  Epigastric pain with EKG changes ?, ST-  elevation myocardial infarction.   HISTORY OF PRESENT ILLNESS:  Mr. Antrim is a 70 year old Caucasian male  with a known history of nonobstructive coronary artery disease per  cardiac cath in May 2008 as well as history significant for GERD and  recent diagnosis by ultrasound of the abdomen of cholelithiasis without  sonographic changes of acute cholecystitis, presenting with 4 to 5 days  of nearly constant epigastric pain.  The patient describes pain as a  burning/pressure in his epigastric area with some radiation to the left  side of the upper left quadrant with some radiation to the left upper  quadrant.  The patient also reports approximately 1 year of intermittent  nausea especially in the a.m. which has continued with the symptoms, but  no other associated symptoms.  The patient's pain became untolerable  today, so he presented to the ED for eval.  When he arrived, an EKG was  completed which showed a new intraventricular conduction delay with ST  elevation in V1 through V6.  A code STEMI was called and cardiology was  alerted.  Upon review of the EKG and the patient symptoms as well as  with the new evidence of negative point-of-care markers, Dr. Riley Kill  downgraded the STEMI to likely noncardiac epigastric pain with new  electrical findings on EKG.  The patient was started on IV nitro and  heparin in the ED and given Zofran as well as Dilaudid IV.  After  receiving Zofran and Dilaudid, his symptoms completely resolved.   PAST MEDICAL HISTORY:  1. GERD.  2. Cholelithiasis, diagnosed on January 16, 2009.  3. Depression.  4. Restless leg syndrome.  5. Dyshidrotic eczema.  6. Osteoarthritis.  7. Nonobstructive coronary artery disease.   SOCIAL HISTORY:  The patient lives in [place] alone, he works part-  time as a Personal assistant.  He denies any tobacco, EtOH, or illicit  drug use.  He has a regular diet with no regular exercise.   FAMILY HISTORY:  There is a history of early coronary artery disease  with his father diagnosed in age 61, otherwise negative for premature  CAD.   REVIEW OF SYSTEMS:  As described in HPI, otherwise all systems reviewed  and were negative.   CODE STATUS:  Full.   ALLERGIES:  1. ERYTHROMYCIN, (NAUSEA AND VOMITING).  2. PENICILLIN, (RASH).   MEDICATIONS:  1. Clonazepam 2 mg p.o. q.h.s.  2. Mirapex  0.25 mg p.o. daily.  3. Enteric-coated aspirin 81 mg p.o. daily.   PHYSICAL EXAMINATION:  VITAL  SIGNS:  Temperature 98.3 degrees  Fahrenheit; BP 153/87, down from 160/94; pulse 66, down from 73;  respiration rate 16; O2 saturation 96% on 4 liters by nasal cannula.  GENERAL:  The patient is alert and oriented x3, in no apparent distress.  He is able to speak fairly easily without any respiratory distress.  HEENT:  Head is normocephalic, atraumatic.  Pupils equal, round, and  reactive to light.  Extraocular muscles are intact.  Nares are patent  without discharge.  Oropharynx without erythema or exudates.  NECK:  Supple without lymphadenopathy.  No JVD.  No thyromegaly.  No  bruits.  HEART:  Heart rate is regular with audible S1 and S2.  No clicks, rubs,  murmurs, or gallops.  Pulses were 2+ and equal in both upper and lower  extremities bilaterally.  LUNGS:  Clear to auscultation bilaterally.  SKIN:  No rashes, lesions, or petechiae.  ABDOMEN:  Soft, nontender, nondistended.  Normal abdominal bowel  sounds.  No rebound or guarding.  No hepatosplenomegaly.  No pulsations.  The  patient is obese.  EXTREMITIES:  Show no clubbing, cyanosis, or edema.  MUSCULOSKELETAL:  Reveals no joint deformity, effusions.  No spinal or  CVA tenderness.  NEURO:  Cranial nerves II through XII are grossly intact.  Strength 5/5  in all extremities and axial groups.  Normal sensation throughout and  normal cerebellar function.   RADIOLOGY:  Ultrasound of the abdomen completed, January 16, 2009, showed  cholelithiasis without sonographic changes of acute cholecystitis.  EKG  shows sinus rhythm at a rate of 64 with a new intraventricular  conduction delay and ST-elevation in V1 through V6, LVH with secondary  repolarization abnormality.  PR 184, QRS 162, and QTC 43, left axis  deviation, intraventricular conduction delay, and ST elevation new from  prior tracing.   LABS:  WBC 9.0, HGB 16.1, HCT 47.1, PLT count 260.  Sodium 140,  potassium 3.7, chloride 104, CO2 28, BUN 13, creatinine 1.33, glucose  107.  First set of point-of-care markers is negative.   ASSESSMENT/PLAN:  Mr. Kruckenberg is a 70 year old Caucasian male with several  months of nausea who now presents with 4 to 5 days of nearly constant  epigastric pain with radiation to the left upper quadrant.  The patient  also reports nausea continuing with and without pain.  He has a prior  history what sounds like fundoplication laparoscopically at [facility].  Ultrasound shows cholelithiasis without thickening of gallbladder.  EKG  shows new left bundle branch block, but point-of-care markers are  negative.  Cardiac cath in 2008 with 30% stenosis at most (see cath  report for full description).  Currently, SGOT is normal.  WBC is  normal.  Now pain free.  Exam without rebound.   RECOMMENDATIONS:  Check serial cardiac enzymes, discontinue heparin if  second point-of-care markers were negative, admit to General Medicine  Service, CT of the abdomen/GI consult.   Defer cath for now.  Reviewed  plan with the family.  We will continue to follow and thank you for the  consult.      Jarrett Ables, PAC      Arturo Morton. Riley Kill, MD, [facility]  Electronically Signed    MS/MEDQ  D:  01/17/2009  T:  01/18/2009  Job:  [id number]

## 2010-10-26 NOTE — Op Note (Signed)
Caleb Chavez, Caleb Chavez                ACCOUNT NO.:  [id number]   MEDICAL RECORD NO.:  [id number]          PATIENT TYPE:  AMB   LOCATION:  DAY                          FACILITY:  [facility]   PHYSICIAN:  Marlowe Kays, M.D.  DATE OF BIRTH:  28-Jan-1941   DATE OF PROCEDURE:  12/03/2005  DATE OF DISCHARGE:                                 OPERATIVE REPORT   PREOPERATIVE DIAGNOSIS:  Torn medial and lateral menisci, right knee.   POSTOPERATIVE DIAGNOSIS:  Torn medial and lateral menisci, right knee.   OPERATION:  Right knee arthroscopy with partial medial and lateral  meniscectomies.   SURGEON:  Marlowe Kays, M.D.   ASSISTANT:  Nurse.   ANESTHESIA:  General.   PATHOLOGY AND JUSTIFICATION FOR PROCEDURE:  Painful right knee with MRI  showing torn medial and lateral menisci.  He is here today for the above-  mentioned surgery.  See operative description below for additional details.   PROCEDURES:  Satisfactory anesthesia, Ace wrap and knee support to left  knee, pneumatic tourniquet applied to right lower extremity and leg  Esmarched out nonsterilely.  We then applied the thigh stabilizer and  prepped the leg from stabilizer to ankle with DuraPrep and draped the knee  in the sterile field.  Superior medial saline inflow.  First through an  anterior medial portal, lateral compartment knee joint was evaluated.  There  was some synovitis present and a little fraying of the lateral portion of  the ACL.  All this was debrided out with the 3.5 shaver with some minimal  shaving of the lateral femoral condyle.  The lateral meniscus was basically  torn throughout its entire extent along the inner border and I trimmed this  back to stable rim with a combination of baskets and a 3.5 shaver.  Final  pictures were taken.  Looking in the lateral gutter and suprapatellar area  there was some mild wear of patella but nothing correctable  arthroscopically.  I then reversed portals. Medially, he had a  extensive  tear involving the entire posterior third of the medial meniscus associated  a little wear, grade 3 out of 4 beneath the torn posterior curve.  I  resected the meniscus back to stable rim with a combination of baskets and a  3.5 shaver with the final rim being stable on probing.  Final pictures were  taken.  The knee joint was irrigated until clear.  All fluid possible  removed.  The anterior portals closed with 4-0 nylon.  I injected 20 mL 0.5%  Marcaine with Adrenalin, 4 mg of morphine through the inflow apparatus which  was removed.  This portal closed with 4-0 nylon as well.  Betadine, Adaptic,  dry sterile dressing were applied.  Tourniquet was released, he tolerated  the procedure well and was taken to recovery in satisfactory condition with  no known complications.           ______________________________  Marlowe Kays, M.D.    JA/MEDQ  D:  12/03/2005  T:  12/03/2005  Job:  [id number]

## 2010-10-26 NOTE — Op Note (Signed)
Caleb Chavez, Caleb Chavez                ACCOUNT NO.:  [id number]   MEDICAL RECORD NO.:  [id number]          PATIENT TYPE:  AMB   LOCATION:  NESC                         FACILITY:  [facility]   PHYSICIAN:  Marlowe Kays, M.D.  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   POSTOP DIAGNOSIS:  Morton's neuroma second and third web space, left  foot.   OPERATION:  Excision Morton's neuroma second and third web space, left  foot.   SURGEON:  Marlowe Kays, M.D.   ASSISTANT:  Nurse.   ANESTHESIA:  General.   PATHOLOGY:  [place]   INDICATIONS FOR PROCEDURE:  He has pain and tenderness in the second and  third web space of the left foot with spreading of the second and third  toes.  X-rays have been unremarkable.  He has had is injections with  temporary relief in the second and third web space.  He has had no  tenderness in the third and fourth web space.  Consequently because of  the failure to respond to nonsurgical treatment; he is here today for  excision of the suspected Morton's neuroma.  See operative description  below for additional pathology.   DESCRIPTION OF PROCEDURE:  Under satisfactory general anesthesia,  pneumatic tourniquet in the left lower extremity was Esmarched out non  sterilely; and the tourniquet inflated to 350 mmHg.  The left leg was  prepped with DuraPrep from mid calf to toes, and draped in a sterile  field.  I made a dorsal web space splitting incision between the second  and third toes and with mainly blunt dissection, I identified the  typical looking Morton's neuroma.  I grasped this with the Allis clamp;  and cut the common digital nerve and adjacent vessel to the second and  third toes with the cutting cautery.  I then freed up the neuroma down  into the metatarsal head area; and cut into  the nerve, at this level,  also with the cutting cautery.  The specimen was sent to pathology.  I  then released the tourniquet.  He had a little bit of bleeding down in  the bed between the metatarsal heads; and just distal to it, which I  could not really correct with coagulation; consequently, I placed some  Gelfoam in this area.  I then infiltrated the soft tissues with 1/2%  plain Marcaine; and closed the wound with interrupted 3-0 Vicryl in the  subcutaneous tissue; and interrupted 4-0 nylon mattress sutures in the  skin.  Betadine, Adaptic, dry sterile dressings were applied.  He  tolerated the procedure well; and was taken to recovery in satisfactory  condition with no known complications.          ______________________________  Marlowe Kays, M.D.    JA/MEDQ  D:  06/26/2006  T:  06/26/2006  Job:  [id number]

## 2010-11-19 ENCOUNTER — Telehealth: Payer: Self-pay | Admitting: *Deleted

## 2010-11-19 ENCOUNTER — Telehealth: Payer: Self-pay | Admitting: Cardiovascular Disease

## 2010-11-19 NOTE — Telephone Encounter (Signed)
Opened in error

## 2010-11-21 ENCOUNTER — Ambulatory Visit (HOSPITAL_COMMUNITY)
Admission: RE | Admit: 2010-11-21 | Discharge: 2010-11-21 | Disposition: A | Discharge status: Home / Self Care | Payer: BC Managed Care – PPO | Source: Ambulatory Visit | Attending: Orthopedic Surgery | Admitting: Orthopedic Surgery

## 2010-11-21 ENCOUNTER — Ambulatory Visit (HOSPITAL_BASED_OUTPATIENT_CLINIC_OR_DEPARTMENT_OTHER)
Admission: RE | Admit: 2010-11-21 | Discharge: 2010-11-21 | Disposition: A | Discharge status: Home / Self Care | Payer: BC Managed Care – PPO | Source: Ambulatory Visit | Attending: Orthopedic Surgery | Admitting: Orthopedic Surgery

## 2010-11-21 DIAGNOSIS — Z01812 Encounter for preprocedural laboratory examination: Secondary | ICD-10-CM | POA: Insufficient documentation

## 2010-11-21 DIAGNOSIS — R0602 Shortness of breath: Secondary | ICD-10-CM | POA: Insufficient documentation

## 2010-11-21 DIAGNOSIS — G5600 Carpal tunnel syndrome, unspecified upper limb: Secondary | ICD-10-CM | POA: Insufficient documentation

## 2010-11-21 DIAGNOSIS — Z01818 Encounter for other preprocedural examination: Secondary | ICD-10-CM | POA: Insufficient documentation

## 2010-11-21 DIAGNOSIS — J8410 Pulmonary fibrosis, unspecified: Secondary | ICD-10-CM | POA: Insufficient documentation

## 2010-11-21 DIAGNOSIS — Z79899 Other long term (current) drug therapy: Secondary | ICD-10-CM | POA: Insufficient documentation

## 2010-11-21 DIAGNOSIS — I509 Heart failure, unspecified: Secondary | ICD-10-CM | POA: Insufficient documentation

## 2010-11-21 DIAGNOSIS — I2510 Atherosclerotic heart disease of native coronary artery without angina pectoris: Secondary | ICD-10-CM | POA: Insufficient documentation

## 2010-11-21 NOTE — Op Note (Addendum)
  NAMEUSAMA, Caleb Chavez                ACCOUNT NO.:  [id number]  MEDICAL RECORD NO.:  [id number]  LOCATION:  XRAY                         FACILITY:  [facility]  PHYSICIAN:  Marlowe Kays, M.D.  DATE OF BIRTH:  31-Mar-1941  DATE OF PROCEDURE:  11/21/2010 DATE OF DISCHARGE:                              OPERATIVE REPORT   PREOPERATIVE DIAGNOSIS:  Left carpal tunnel syndrome.  POSTOPERATIVE DIAGNOSIS:  Left carpal tunnel syndrome.  OPERATION:  Decompression of median nerve, left wrist and hand.  SURGEON:  Marlowe Kays, M.D.  ASSISTANT:  Nurse.  ANESTHESIA:  IV regional.  PLAN/JUSTIFICATION FOR PROCEDURE:  Signs and symptoms of carpal tunnel syndrome with the diagnosis confirmed by EMG nerve conduction studies. He has had a successful carpal tunnel release years ago on the right by me.  PROCEDURE:  Satisfactory regional anesthesia, DuraPrep from midforearm to fingertips, was draped in sterile field.  Time-out performed.  I marked out a curved incision in the mid palm crossing curvilinearly across the wrist into the distal forearm.  The palmaris longus tendon was identified and beneath it the median nerve which I carefully protected as I released skin, subcutaneous tissue and fascia into the distal palm.  He had a very tight mid palmar fascia, compression of the median nerve which was thinned out and contused.  When the potential bleeders were coagulated with bipolar cautery when I completely decompressed and irrigated the wound well with sterile saline and closed the skin and subcutaneous tissue only with interrupted 4-0 nylon mattress sutures.  Betadine, Adaptic, dry sterile dressing and a plaster splint were applied.  Tourniquet was released, at the time of this dictation was on his way to recovery room in satisfactory condition with no known complications.          ______________________________ Marlowe Kays, M.D.     JA/MEDQ  D:  11/21/2010  T:  11/21/2010  Job:   [id number]  Electronically Signed by Marlowe Kays M.D. on 12/05/2010 02:21:48 PM

## 2010-12-20 ENCOUNTER — Other Ambulatory Visit: Payer: Self-pay | Admitting: *Deleted

## 2010-12-20 ENCOUNTER — Telehealth: Payer: Self-pay | Admitting: Gastroenterology

## 2010-12-20 MED ORDER — PANTOPRAZOLE SODIUM 40 MG PO TBEC: 40.0000 mg | DELAYED_RELEASE_TABLET | Freq: Every day | ORAL | Status: DC

## 2010-12-20 NOTE — Telephone Encounter (Signed)
Left message on machine to call back  

## 2010-12-24 NOTE — Telephone Encounter (Signed)
Unable to reach pt by phone letter mailed.  

## 2010-12-24 NOTE — Telephone Encounter (Signed)
Left message on machine to call back  

## 2011-02-13 ENCOUNTER — Other Ambulatory Visit: Payer: Self-pay | Admitting: *Deleted

## 2011-02-13 MED ORDER — MIRTAZAPINE 15 MG PO TABS: 15.0000 mg | ORAL_TABLET | Freq: Every day | ORAL | Status: DC

## 2011-02-13 NOTE — Telephone Encounter (Signed)
Rx done electronically 

## 2011-03-07 LAB — LIPID PANEL
Cholesterol: 151
HDL: 31 — ABNORMAL LOW
LDL Cholesterol: 98
Total CHOL/HDL Ratio: 4.9
Triglycerides: 112
VLDL: 22

## 2011-03-07 LAB — BASIC METABOLIC PANEL
BUN: 9
Calcium: 8.2 — ABNORMAL LOW
Creatinine, Ser: 1.02
GFR calc non Af Amer: >60
Glucose, Bld: 89
Potassium: 3.9

## 2011-03-07 LAB — POCT CARDIAC MARKERS
CKMB, poc: 1.3
Myoglobin, poc: 106
Operator id: 277751

## 2011-03-07 LAB — POCT I-STAT, CHEM 8
BUN: 16
Creatinine, Ser: 1.2
Hemoglobin: 15.6
Potassium: 3.3 — ABNORMAL LOW
Sodium: 142
TCO2: 25

## 2011-03-07 LAB — CK TOTAL AND CKMB (NOT AT ARMC): Total CK: 99

## 2011-03-07 LAB — CBC
HCT: 40.1
Hemoglobin: 13.7
MCHC: 34.1
MCV: 90.3
Platelets: 220
RDW: 13.9

## 2011-03-07 LAB — CARDIAC PANEL(CRET KIN+CKTOT+MB+TROPI): Troponin I: 0.02

## 2011-03-07 LAB — D-DIMER, QUANTITATIVE: D-Dimer, Quant: 0.44

## 2011-03-11 LAB — POCT I-STAT, CHEM 8
Calcium, Ion: 1.19
Chloride: 106
Glucose, Bld: 164 — ABNORMAL HIGH
HCT: 43
TCO2: 25

## 2011-03-11 LAB — URINALYSIS, ROUTINE W REFLEX MICROSCOPIC
Bilirubin Urine: NEGATIVE
Ketones, ur: NEGATIVE
Nitrite: NEGATIVE
Protein, ur: 30 — AB
pH: 5.5

## 2011-03-11 LAB — URINE MICROSCOPIC-ADD ON

## 2011-03-11 LAB — POCT HEMOGLOBIN-HEMACUE: Hemoglobin: 14.6

## 2011-04-01 ENCOUNTER — Encounter: Payer: BC Managed Care – PPO | Admitting: Internal Medicine

## 2011-04-01 DIAGNOSIS — Z0289 Encounter for other administrative examinations: Secondary | ICD-10-CM

## 2011-07-02 ENCOUNTER — Other Ambulatory Visit: Payer: Self-pay | Admitting: *Deleted

## 2011-07-02 NOTE — Telephone Encounter (Signed)
Fax request asking for Pramipexole Dihydrochloride 0.5 Mg Tabs (Pramipexole dihydrochloride) .Marland Kitchen.. 1-2 tabs at bedtime for restless legs, which is not on med list, please advise

## 2011-07-02 NOTE — Telephone Encounter (Signed)
Tried to reach patient thru different numbers, some are disconnected and some are wrong, left message at cell asking pt to return my call.

## 2011-07-02 NOTE — Telephone Encounter (Signed)
Patient called back left message on triage for me to call him. .left message to have patient return my call.

## 2011-07-02 NOTE — Telephone Encounter (Signed)
Sounds familiar though Confirm he is on this Okay for 2 month supply Needs appt

## 2011-07-03 MED ORDER — PRAMIPEXOLE DIHYDROCHLORIDE 0.25 MG PO TABS: 0.2500 mg | ORAL_TABLET | Freq: Every day | ORAL | Status: DC

## 2011-07-03 NOTE — Telephone Encounter (Signed)
rx sent to pharmacy by e-script  

## 2011-09-16 ENCOUNTER — Other Ambulatory Visit: Payer: Self-pay | Admitting: Internal Medicine

## 2011-12-27 ENCOUNTER — Ambulatory Visit (INDEPENDENT_AMBULATORY_CARE_PROVIDER_SITE_OTHER): Payer: BC Managed Care – PPO | Admitting: Internal Medicine

## 2011-12-27 ENCOUNTER — Encounter: Payer: Self-pay | Admitting: Internal Medicine

## 2011-12-27 VITALS — BP 136/80 | HR 58 | Temp 97.7°F | Ht 75.0 in | Wt 232.0 lb

## 2011-12-27 DIAGNOSIS — G2581 Restless legs syndrome: Secondary | ICD-10-CM | POA: Diagnosis not present

## 2011-12-27 DIAGNOSIS — M159 Polyosteoarthritis, unspecified: Secondary | ICD-10-CM

## 2011-12-27 DIAGNOSIS — K219 Gastro-esophageal reflux disease without esophagitis: Secondary | ICD-10-CM

## 2011-12-27 DIAGNOSIS — I5022 Chronic systolic (congestive) heart failure: Secondary | ICD-10-CM | POA: Diagnosis not present

## 2011-12-27 DIAGNOSIS — N529 Male erectile dysfunction, unspecified: Secondary | ICD-10-CM

## 2011-12-27 DIAGNOSIS — I10 Essential (primary) hypertension: Secondary | ICD-10-CM | POA: Diagnosis not present

## 2011-12-27 MED ORDER — LISINOPRIL 10 MG PO TABS: 10.0000 mg | ORAL_TABLET | Freq: Every day | ORAL | Status: DC

## 2011-12-27 MED ORDER — TRAMADOL HCL 50 MG PO TABS: 50.0000 mg | ORAL_TABLET | Freq: Three times a day (TID) | ORAL | Status: AC | PRN

## 2011-12-27 MED ORDER — PANTOPRAZOLE SODIUM 40 MG PO TBEC: 40.0000 mg | DELAYED_RELEASE_TABLET | Freq: Every day | ORAL | Status: DC

## 2011-12-27 NOTE — Assessment & Plan Note (Signed)
Does okay on the protonix

## 2011-12-27 NOTE — Patient Instructions (Signed)
Please set up blood work in about 3-4 weeks----- met b, hepatic, CBC with diff, TSH  ([phone])

## 2011-12-27 NOTE — Progress Notes (Signed)
Subjective:    Patient ID: Caleb Chavez, male    DOB: 04-17-1941, 71 y.o.   MRN: [id number]  HPI Doing well Insurance got "screwed up" when he retired---took a while to get straight  Still with knee pain and the tops of his feet Some knuckle swelling at times also Discussed that he should not be on the meloxicam (Dr [place]) Does exercise regularly---walking and still works part time at [employer]  No chest pain No edema or rarely gets in ankles No SOB---exercise tolerance is good Sleeps flat--no PND Nocturia x 2---stable  Ran out of clonazepam RLS seems to be better lately  Remarried Happy again Moved to new home---everything going well  Current Outpatient Prescriptions on File Prior to Visit  Medication Sig Dispense Refill  . pantoprazole (PROTONIX) 40 MG tablet Take 1 tablet (40 mg total) by mouth daily.  30 tablet  4  . pramipexole (MIRAPEX) 0.25 MG tablet Take 1 tablet (0.25 mg total) by mouth at bedtime.  30 tablet  1    Allergies  Allergen Reactions  . Penicillins     REACTION: unspecified    Past Medical History  Diagnosis Date  . Depression   . GERD (gastroesophageal reflux disease)   . RLS (restless legs syndrome)   . Sleep disturbance     due to RLS  . Dyshidrotic eczema   . OA (osteoarthritis)   . Gastritis and duodenitis   . Heart failure     EF 40%, Echo 01-2009  . CAD (coronary artery disease)     Non obstructive (30-40% Circ, 40% RCA AM,2008)    Past Surgical History  Procedure Date  . Esophagogastric fundoplication 1996    scope-[facility]  . Kidney stone surgery 1997/2000  . Carpal tunnel release 05/2003    Right ([place])  . Ureteroscopy 05/2008    [place]  . Cataract extraction 10/2008    Left  . Cataract extraction 11/2008    Right  . Laparoscopic cholecystectomy 04/2009    Dr. Michaell Cowing    Family History  Problem Relation Age of Onset  . Heart attack Father     4 total and bypass  . Cancer Mother     unknown type     History   Social History  . Marital Status: Divorced    Spouse Name: N/A    Number of Children: 3  . Years of Education: N/A   Occupational History  . 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 Smoking status: Never Smoker   . Smokeless tobacco: Never Used  . Alcohol Use: Yes     occasional  . Drug Use: Not on file  . Sexually Active: Not on file   Other Topics Concern  . Not on file   Social History Narrative  . No narrative on file   Review of Systems Appetite is good Weight stable Bowels are fine Having ED ---interested in trying Rx    Objective:   Physical Exam  Constitutional: He appears well-developed and well-nourished. No distress.  Neck: Normal range of motion. Neck supple. No thyromegaly present.  Cardiovascular: Normal rate, regular rhythm, normal heart sounds and intact distal pulses.  Exam reveals no gallop.   No murmur heard. Pulmonary/Chest: Effort normal and breath sounds normal. No respiratory distress. He has no wheezes. He has no rales.  Abdominal: Soft. There is no tenderness.  Musculoskeletal: He exhibits no edema and no tenderness.  Lymphadenopathy:    He has no cervical adenopathy.  Psychiatric: He has a normal mood and affect. His behavior is normal. Thought content normal.          Assessment & Plan:

## 2011-12-27 NOTE — Assessment & Plan Note (Signed)
Will stop the NSAID given cardiac history Regular tylenol Tramadol for prn

## 2011-12-27 NOTE — Assessment & Plan Note (Signed)
BP Readings from Last 3 Encounters:  12/27/11 136/80  09/27/10 140/60  06/26/10 156/82   bp generally okay but will start ACEI with the CHF Check labs in a few weeks

## 2011-12-27 NOTE — Assessment & Plan Note (Signed)
Doing okay on the requip Hold off on clonazepam for now

## 2011-12-27 NOTE — Assessment & Plan Note (Signed)
Will give samples of cialis

## 2011-12-27 NOTE — Assessment & Plan Note (Signed)
Completely asymptomatic Will stop the NSAID Start low dose ACEI

## 2012-01-16 ENCOUNTER — Other Ambulatory Visit: Payer: Self-pay | Admitting: Internal Medicine

## 2012-01-16 DIAGNOSIS — I5022 Chronic systolic (congestive) heart failure: Secondary | ICD-10-CM

## 2012-01-24 ENCOUNTER — Other Ambulatory Visit (INDEPENDENT_AMBULATORY_CARE_PROVIDER_SITE_OTHER): Payer: BC Managed Care – PPO

## 2012-01-24 DIAGNOSIS — I5022 Chronic systolic (congestive) heart failure: Secondary | ICD-10-CM

## 2012-01-24 LAB — CBC WITH DIFFERENTIAL/PLATELET
Basophils Relative: 0.6 % (ref 0.0–3.0)
Eosinophils Absolute: 0.1 10*3/uL (ref 0.0–0.7)
Hemoglobin: 14.2 g/dL (ref 13.0–17.0)
Lymphs Abs: 3.1 10*3/uL (ref 0.7–4.0)
MCHC: 32.6 g/dL (ref 30.0–36.0)
MCV: 91.2 fl (ref 78.0–100.0)
Monocytes Absolute: 0.6 10*3/uL (ref 0.1–1.0)
Neutro Abs: 5 10*3/uL (ref 1.4–7.7)
RBC: 4.79 Mil/uL (ref 4.22–5.81)
RDW: 13.7 % (ref 11.5–14.6)

## 2012-01-24 LAB — HEPATIC FUNCTION PANEL
AST: 19 U/L (ref 0–37)
Bilirubin, Direct: 0.2 mg/dL (ref 0.0–0.3)
Total Bilirubin: 0.9 mg/dL (ref 0.3–1.2)

## 2012-01-24 LAB — BASIC METABOLIC PANEL
BUN: 23 mg/dL (ref 6–23)
CO2: 26 mEq/L (ref 19–32)
Glucose, Bld: 136 mg/dL — ABNORMAL HIGH (ref 70–99)
Potassium: 4.9 mEq/L (ref 3.5–5.1)
Sodium: 139 mEq/L (ref 135–145)

## 2012-02-04 ENCOUNTER — Encounter: Payer: Self-pay | Admitting: *Deleted

## 2012-02-21 ENCOUNTER — Ambulatory Visit (INDEPENDENT_AMBULATORY_CARE_PROVIDER_SITE_OTHER): Payer: Medicare Other | Admitting: Internal Medicine

## 2012-02-21 ENCOUNTER — Encounter: Payer: Self-pay | Admitting: Internal Medicine

## 2012-02-21 VITALS — BP 130/70 | HR 66 | Temp 98.5°F | Wt 229.0 lb

## 2012-02-21 DIAGNOSIS — I5022 Chronic systolic (congestive) heart failure: Secondary | ICD-10-CM

## 2012-02-21 DIAGNOSIS — M159 Polyosteoarthritis, unspecified: Secondary | ICD-10-CM | POA: Diagnosis not present

## 2012-02-21 DIAGNOSIS — I10 Essential (primary) hypertension: Secondary | ICD-10-CM

## 2012-02-21 MED ORDER — TRAMADOL HCL 50 MG PO TABS: 50.0000 mg | ORAL_TABLET | Freq: Three times a day (TID) | ORAL | Status: DC | PRN

## 2012-02-21 NOTE — Assessment & Plan Note (Signed)
Mostly in knees Some limitation of his activity Will have him start the acetaminophen tid daily (he never did this) Tramadol for more severe pain

## 2012-02-21 NOTE — Progress Notes (Signed)
Subjective:    Patient ID: Caleb Chavez, male    DOB: August 07, 1940, 71 y.o.   MRN: [id number]  HPI Here with wife Gardiner Ramus  Having a lot of trouble with his knees Get very stiff---especially in AM. Also if any prolonged sitting Not clearly worse off the antiinflammatory med Not really any worse than the last visit Hasn't been taking the acetaminophen regularly  No problems on the lisinopril He has noted some dizziness when he gets up or moves May have some sweating with this Unsteady feeling --not apparently orthostatic Does get tired quicker than he feels is normal---has to stop outdoor activities No chest pain  Current Outpatient Prescriptions on File Prior to Visit  Medication Sig Dispense Refill  . acetaminophen (TYLENOL) 650 MG CR tablet Take 650 mg by mouth 3 (three) times daily.      Marland Kitchen lisinopril (PRINIVIL,ZESTRIL) 10 MG tablet Take 1 tablet (10 mg total) by mouth daily.  90 tablet  3  . pantoprazole (PROTONIX) 40 MG tablet Take 1 tablet (40 mg total) by mouth daily.  30 tablet  11  . pramipexole (MIRAPEX) 0.25 MG tablet Take 1 tablet (0.25 mg total) by mouth at bedtime.  30 tablet  1    Allergies  Allergen Reactions  . Penicillins     REACTION: unspecified    Past Medical History  Diagnosis Date  . Depression   . GERD (gastroesophageal reflux disease)   . RLS (restless legs syndrome)   . Sleep disturbance     due to RLS  . Dyshidrotic eczema   . OA (osteoarthritis)   . Gastritis and duodenitis   . Heart failure     EF 40%, Echo 01-2009  . CAD (coronary artery disease)     Non obstructive (30-40% Circ, 40% RCA AM,2008)  . ED (erectile dysfunction)     Past Surgical History  Procedure Date  . Esophagogastric fundoplication 1996    scope-[facility]  . Kidney stone surgery 1997/2000  . Carpal tunnel release 05/2003    Right ([place])  . Ureteroscopy 05/2008    [place]  . Cataract extraction 10/2008    Left  . Cataract extraction 11/2008    Right  .  Laparoscopic cholecystectomy 04/2009    Dr. Michaell Cowing    Family History  Problem Relation Age of Onset  . Heart attack Father     4 total and bypass  . Cancer Mother     unknown type    History   Social History  . Marital Status: Married    Spouse Name: N/A    Number of Children: 3  . Years of Education: N/A   Occupational History  . 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 Smoking status: Never Smoker   . Smokeless tobacco: Never Used  . Alcohol Use: Yes     occasional  . Drug Use: Not on file  . Sexually Active: Not on file   Other Topics Concern  . Not on file   Social History Narrative   Divorced then remarried 5/12   Review of Systems No cough No throat symptoms    Objective:   Physical Exam  Constitutional: He appears well-developed and well-nourished. No distress.  Neck: Normal range of motion. Neck supple. No thyromegaly present.  Cardiovascular: Normal rate, regular rhythm and normal heart sounds.  Exam reveals no gallop.   No  murmur heard. Pulmonary/Chest: Effort normal and breath sounds normal. No respiratory distress. He has no wheezes. He has no rales.  Musculoskeletal: He exhibits no edema and no tenderness.       Fair ROM of knees No sig crepitus No ligament or meniscus instability  Lymphadenopathy:    He has no cervical adenopathy.  Neurological:       Normal gait and strength  Psychiatric: He has a normal mood and affect. His behavior is normal.          Assessment & Plan:

## 2012-02-21 NOTE — Assessment & Plan Note (Signed)
BP Readings from Last 3 Encounters:  02/21/12 130/70  12/27/11 136/80  09/27/10 140/60   Good control No changes

## 2012-02-21 NOTE — Assessment & Plan Note (Signed)
Still notes some limitations in exercise tolerance Now on ACEI Will see how he does in the fall

## 2012-05-08 ENCOUNTER — Other Ambulatory Visit: Payer: Self-pay | Admitting: Internal Medicine

## 2012-07-14 ENCOUNTER — Encounter: Payer: Self-pay | Admitting: Internal Medicine

## 2012-07-14 ENCOUNTER — Ambulatory Visit (INDEPENDENT_AMBULATORY_CARE_PROVIDER_SITE_OTHER): Payer: Medicare Other | Admitting: Internal Medicine

## 2012-07-14 VITALS — BP 122/70 | HR 64 | Temp 97.9°F | Ht 73.0 in | Wt 240.0 lb

## 2012-07-14 DIAGNOSIS — Z0000 Encounter for general adult medical examination without abnormal findings: Secondary | ICD-10-CM | POA: Diagnosis not present

## 2012-07-14 DIAGNOSIS — G2581 Restless legs syndrome: Secondary | ICD-10-CM

## 2012-07-14 DIAGNOSIS — I5022 Chronic systolic (congestive) heart failure: Secondary | ICD-10-CM | POA: Diagnosis not present

## 2012-07-14 DIAGNOSIS — Z23 Encounter for immunization: Secondary | ICD-10-CM | POA: Diagnosis not present

## 2012-07-14 DIAGNOSIS — L570 Actinic keratosis: Secondary | ICD-10-CM

## 2012-07-14 DIAGNOSIS — I10 Essential (primary) hypertension: Secondary | ICD-10-CM

## 2012-07-14 DIAGNOSIS — K219 Gastro-esophageal reflux disease without esophagitis: Secondary | ICD-10-CM

## 2012-07-14 DIAGNOSIS — M159 Polyosteoarthritis, unspecified: Secondary | ICD-10-CM | POA: Diagnosis not present

## 2012-07-14 HISTORY — DX: Actinic keratosis: L57.0

## 2012-07-14 LAB — CBC WITH DIFFERENTIAL/PLATELET
Basophils Relative: 1 % (ref 0.0–3.0)
Eosinophils Absolute: 0.1 10*3/uL (ref 0.0–0.7)
HCT: 44.5 % (ref 39.0–52.0)
Hemoglobin: 14.9 g/dL (ref 13.0–17.0)
Lymphocytes Relative: 37.6 % (ref 12.0–46.0)
MCHC: 33.4 g/dL (ref 30.0–36.0)
Neutro Abs: 4.7 10*3/uL (ref 1.4–7.7)
RBC: 4.95 Mil/uL (ref 4.22–5.81)

## 2012-07-14 LAB — BASIC METABOLIC PANEL
CO2: 28 mEq/L (ref 19–32)
Calcium: 9.5 mg/dL (ref 8.4–10.5)
Chloride: 107 mEq/L (ref 96–112)
Potassium: 4.6 mEq/L (ref 3.5–5.1)
Sodium: 141 mEq/L (ref 135–145)

## 2012-07-14 LAB — HEPATIC FUNCTION PANEL
ALT: 26 U/L (ref 0–53)
AST: 21 U/L (ref 0–37)
Albumin: 4.5 g/dL (ref 3.5–5.2)
Alkaline Phosphatase: 69 U/L (ref 39–117)
Bilirubin, Direct: 0 mg/dL (ref 0.0–0.3)
Total Protein: 8.2 g/dL (ref 6.0–8.3)

## 2012-07-14 LAB — LIPID PANEL
HDL: 34.3 mg/dL — ABNORMAL LOW (ref >39.00)
Total CHOL/HDL Ratio: 4

## 2012-07-14 MED ORDER — TRAMADOL HCL 50 MG PO TABS: 50.0000 mg | ORAL_TABLET | Freq: Three times a day (TID) | ORAL | Status: DC | PRN

## 2012-07-14 NOTE — Addendum Note (Signed)
Addended by: Sueanne Margarita on: 07/14/2012 12:10 PM   Modules accepted: Orders

## 2012-07-14 NOTE — Assessment & Plan Note (Signed)
Quiet on the med 

## 2012-07-14 NOTE — Assessment & Plan Note (Signed)
Does okay with the tramadol at night

## 2012-07-14 NOTE — Assessment & Plan Note (Signed)
I have personally reviewed the Medicare Annual Wellness questionnaire and have noted 1. The patient's medical and social history 2. Their use of alcohol, tobacco or illicit drugs 3. Their current medications and supplements 4. The patient's functional ability including ADL's, fall risks, home safety risks and hearing or visual             impairment. 5. Diet and physical activities 6. Evidence for depression or mood disorders  The patients weight, height, BMI and visual acuity have been recorded in the chart I have made referrals, counseling and provided education to the patient based review of the above and I have provided the pt with a written personalized care plan for preventive services.  I have provided you with a copy of your personalized plan for preventive services. Please take the time to review along with your updated medication list.  Due for Td and flu No cancer screening ---UTD with colon and no PSA at his age Needs to work on fitness---have to deal with knee pain

## 2012-07-14 NOTE — Progress Notes (Signed)
Subjective:    Patient ID: Caleb Chavez, male    DOB: 06/04/1941, 72 y.o.   MRN: [id number]  HPI Here for wellness visit No other doctors  Vision is fine Has problems with left ear--gets along okay No depression or anhedonia No alcohol or tobacco Doesn't exercise---feels his knees and feet limit him. Doesn't note any cognitive problems UTD with colon. Will update imms today  Only gets mild relief from tramadol Pain just sitting and trying to stand  Sleeping fairly well He uses the tramadol with success for his RLS at night  No chest pain No SOB Has had some mild dizziness--no syncope. Usually when he stands quickly---just takes a brief time and keeps going No edema Sleeps flat---no PND  No heartburn problems on the PPI No swallowing problems  Current Outpatient Prescriptions on File Prior to Visit  Medication Sig Dispense Refill  . acetaminophen (TYLENOL) 650 MG CR tablet Take 650 mg by mouth 3 (three) times daily.      . B Complex-C (B-COMPLEX WITH VITAMIN C) tablet Take 1 tablet by mouth daily.      . fish oil-omega-3 fatty acids 1000 MG capsule Take 2 g by mouth daily.      Marland Kitchen lisinopril (PRINIVIL,ZESTRIL) 10 MG tablet Take 1 tablet (10 mg total) by mouth daily.  90 tablet  3  . Multiple Vitamin (MULTIVITAMIN) tablet Take 1 tablet by mouth daily.      . pantoprazole (PROTONIX) 40 MG tablet Take 1 tablet (40 mg total) by mouth daily.  30 tablet  11  . traMADol (ULTRAM) 50 MG tablet TAKE 1 TABLET THREE TIMES A DAY AS NEEDED FOR PAIN  60 tablet  0  . vitamin C (ASCORBIC ACID) 500 MG tablet Take 500 mg by mouth daily.        Allergies  Allergen Reactions  . Penicillins     REACTION: unspecified    Past Medical History  Diagnosis Date  . Depression   . GERD (gastroesophageal reflux disease)   . RLS (restless legs syndrome)   . Sleep disturbance     due to RLS  . Dyshidrotic eczema   . OA (osteoarthritis)   . Gastritis and duodenitis   . Heart failure     EF  40%, Echo 01-2009  . CAD (coronary artery disease)     Non obstructive (30-40% Circ, 40% RCA AM,2008)  . ED (erectile dysfunction)     Past Surgical History  Procedure Date  . Esophagogastric fundoplication 1996    scope-[facility]  . Kidney stone surgery 1997/2000  . Carpal tunnel release 05/2003    Right ([place])  . Ureteroscopy 05/2008    [place]  . Cataract extraction 10/2008    Left  . Cataract extraction 11/2008    Right  . Laparoscopic cholecystectomy 04/2009    Dr. Michaell Cowing    Family History  Problem Relation Age of Onset  . Heart attack Father     4 total and bypass  . Cancer Mother     unknown type    History   Social History  . Marital Status: Married    Spouse Name: N/A    Number of Children: 3  . Years of Education: N/A   Occupational History  . 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 Smoking status: Never Smoker   . Smokeless tobacco: Never Used  .  Alcohol Use: Yes     Comment: occasional  . Drug Use: Not on file  . Sexually Active: Not on file   Other Topics Concern  . Not on file   Social History Narrative   Divorced then remarried 5/12Has living willRequests nephew Duaine Dredge as his health care POA. Forms given for him to do (07/14/12)Would accept resuscitation attemptsProbably would accept a feeding tube--at least for some time   Review of Systems Bowels have been fine No voiding trouble Some dry skin around nose---uses lotion with success    Objective:   Physical Exam  Constitutional: He is oriented to person, place, and time. He appears well-developed and well-nourished. No distress.  Neck: Normal range of motion. Neck supple. No thyromegaly present.  Cardiovascular: Normal rate, regular rhythm, normal heart sounds and intact distal pulses.  Exam reveals no gallop.   No murmur heard. Pulmonary/Chest: Effort normal and breath sounds normal.  No respiratory distress. He has no wheezes. He has no rales.  Abdominal: Soft. There is no tenderness.  Musculoskeletal: He exhibits no edema.  Lymphadenopathy:    He has no cervical adenopathy.  Neurological: He is alert and oriented to person, place, and time.       President-- "Obama, Clinton, (couldn't get Bush)" [id number] (with difficulty) [id number] Recall 2/3  Skin: No rash noted. No erythema.       6mm raised crusted lesion on left forearm  Psychiatric: He has a normal mood and affect. His behavior is normal.          Assessment & Plan:

## 2012-07-14 NOTE — Assessment & Plan Note (Signed)
Limiting him in knees Will set up with ortho

## 2012-07-14 NOTE — Assessment & Plan Note (Signed)
BP Readings from Last 3 Encounters:  07/14/12 122/70  02/21/12 130/70  12/27/11 136/80   continuing med due to mild decrease in EF

## 2012-07-14 NOTE — Assessment & Plan Note (Signed)
Liquid nitrogen 45 seconds x 2 Tolerated well Discussed home care 

## 2012-07-14 NOTE — Assessment & Plan Note (Signed)
Borderline  Will continue the ACEI despite mild orthostasis because of this On ASA Cholesterol levels good---will recheck

## 2012-07-27 DIAGNOSIS — M1710 Unilateral primary osteoarthritis, unspecified knee: Secondary | ICD-10-CM | POA: Diagnosis not present

## 2012-08-19 ENCOUNTER — Other Ambulatory Visit (INDEPENDENT_AMBULATORY_CARE_PROVIDER_SITE_OTHER): Payer: Medicare Other

## 2012-08-19 DIAGNOSIS — R946 Abnormal results of thyroid function studies: Secondary | ICD-10-CM

## 2012-08-19 DIAGNOSIS — R7989 Other specified abnormal findings of blood chemistry: Secondary | ICD-10-CM

## 2012-10-06 ENCOUNTER — Other Ambulatory Visit: Payer: Self-pay | Admitting: Internal Medicine

## 2013-01-21 ENCOUNTER — Other Ambulatory Visit: Payer: Self-pay | Admitting: Internal Medicine

## 2013-03-02 ENCOUNTER — Other Ambulatory Visit: Payer: Self-pay | Admitting: Internal Medicine

## 2013-04-15 ENCOUNTER — Other Ambulatory Visit: Payer: Self-pay

## 2013-04-16 ENCOUNTER — Other Ambulatory Visit: Payer: Self-pay | Admitting: Internal Medicine

## 2013-04-16 NOTE — Telephone Encounter (Signed)
Okay #[phone] x 0 

## 2013-04-16 NOTE — Telephone Encounter (Signed)
rx called into pharmacy

## 2013-06-14 ENCOUNTER — Other Ambulatory Visit: Payer: Self-pay | Admitting: Internal Medicine

## 2013-07-14 ENCOUNTER — Encounter: Payer: Medicare Other | Admitting: Internal Medicine

## 2013-07-23 ENCOUNTER — Encounter: Payer: Medicare Other | Admitting: Internal Medicine

## 2013-10-12 ENCOUNTER — Other Ambulatory Visit: Payer: Self-pay | Admitting: Internal Medicine

## 2013-11-12 ENCOUNTER — Encounter (HOSPITAL_COMMUNITY): Payer: Self-pay | Admitting: Emergency Medicine

## 2013-11-12 ENCOUNTER — Emergency Department (HOSPITAL_COMMUNITY)
Admission: EM | Admit: 2013-11-12 | Discharge: 2013-11-12 | Disposition: A | Discharge status: Home / Self Care | Payer: Medicare Other | Attending: Emergency Medicine | Admitting: Emergency Medicine

## 2013-11-12 ENCOUNTER — Emergency Department (HOSPITAL_COMMUNITY): Payer: Medicare Other

## 2013-11-12 DIAGNOSIS — N201 Calculus of ureter: Secondary | ICD-10-CM | POA: Diagnosis not present

## 2013-11-12 DIAGNOSIS — Z9089 Acquired absence of other organs: Secondary | ICD-10-CM | POA: Diagnosis not present

## 2013-11-12 DIAGNOSIS — I509 Heart failure, unspecified: Secondary | ICD-10-CM | POA: Insufficient documentation

## 2013-11-12 DIAGNOSIS — Z9889 Other specified postprocedural states: Secondary | ICD-10-CM | POA: Diagnosis not present

## 2013-11-12 DIAGNOSIS — Z8659 Personal history of other mental and behavioral disorders: Secondary | ICD-10-CM | POA: Diagnosis not present

## 2013-11-12 DIAGNOSIS — Z880 Allergy status to penicillin: Secondary | ICD-10-CM | POA: Diagnosis not present

## 2013-11-12 DIAGNOSIS — K219 Gastro-esophageal reflux disease without esophagitis: Secondary | ICD-10-CM | POA: Insufficient documentation

## 2013-11-12 DIAGNOSIS — R1110 Vomiting, unspecified: Secondary | ICD-10-CM | POA: Diagnosis not present

## 2013-11-12 DIAGNOSIS — Z872 Personal history of diseases of the skin and subcutaneous tissue: Secondary | ICD-10-CM | POA: Insufficient documentation

## 2013-11-12 DIAGNOSIS — Z791 Long term (current) use of non-steroidal anti-inflammatories (NSAID): Secondary | ICD-10-CM | POA: Diagnosis not present

## 2013-11-12 DIAGNOSIS — M1990 Unspecified osteoarthritis, unspecified site: Secondary | ICD-10-CM | POA: Diagnosis not present

## 2013-11-12 DIAGNOSIS — Z79899 Other long term (current) drug therapy: Secondary | ICD-10-CM | POA: Diagnosis not present

## 2013-11-12 DIAGNOSIS — I2510 Atherosclerotic heart disease of native coronary artery without angina pectoris: Secondary | ICD-10-CM | POA: Diagnosis not present

## 2013-11-12 DIAGNOSIS — R918 Other nonspecific abnormal finding of lung field: Secondary | ICD-10-CM | POA: Diagnosis not present

## 2013-11-12 DIAGNOSIS — N1330 Unspecified hydronephrosis: Secondary | ICD-10-CM | POA: Diagnosis not present

## 2013-11-12 DIAGNOSIS — N200 Calculus of kidney: Secondary | ICD-10-CM | POA: Diagnosis not present

## 2013-11-12 HISTORY — DX: Disorder of kidney and ureter, unspecified: N28.9

## 2013-11-12 LAB — CBC WITH DIFFERENTIAL/PLATELET
BASOS ABS: 0.1 10*3/uL (ref 0.0–0.1)
BASOS PCT: 1 % (ref 0–1)
EOS ABS: 0 10*3/uL (ref 0.0–0.7)
Eosinophils Relative: 0 % (ref 0–5)
HCT: 45 % (ref 39.0–52.0)
HEMOGLOBIN: 14.8 g/dL (ref 13.0–17.0)
Lymphocytes Relative: 21 % (ref 12–46)
Lymphs Abs: 2.4 10*3/uL (ref 0.7–4.0)
MCH: 29.5 pg (ref 26.0–34.0)
MCHC: 32.9 g/dL (ref 30.0–36.0)
MCV: 89.6 fL (ref 78.0–100.0)
Monocytes Absolute: 0.8 10*3/uL (ref 0.1–1.0)
Monocytes Relative: 7 % (ref 3–12)
NEUTROS ABS: 8.1 10*3/uL — AB (ref 1.7–7.7)
NEUTROS PCT: 71 % (ref 43–77)
Platelets: 345 10*3/uL (ref 150–400)
RBC: 5.02 MIL/uL (ref 4.22–5.81)
RDW: 13.3 % (ref 11.5–15.5)
WBC: 11.4 10*3/uL — ABNORMAL HIGH (ref 4.0–10.5)

## 2013-11-12 LAB — LIPASE, BLOOD: LIPASE: 24 U/L (ref 11–59)

## 2013-11-12 LAB — COMPREHENSIVE METABOLIC PANEL
ALBUMIN: 4.2 g/dL (ref 3.5–5.2)
ALT: 23 U/L (ref 0–53)
AST: 30 U/L (ref 0–37)
Alkaline Phosphatase: 78 U/L (ref 39–117)
BILIRUBIN TOTAL: 1 mg/dL (ref 0.3–1.2)
BUN: 23 mg/dL (ref 6–23)
CHLORIDE: 99 meq/L (ref 96–112)
CO2: 23 mEq/L (ref 19–32)
Calcium: 9.9 mg/dL (ref 8.4–10.5)
Creatinine, Ser: 1.62 mg/dL — ABNORMAL HIGH (ref 0.50–1.35)
GFR calc Af Amer: 46 mL/min — ABNORMAL LOW (ref >90)
GFR calc non Af Amer: 40 mL/min — ABNORMAL LOW (ref >90)
Glucose, Bld: 152 mg/dL — ABNORMAL HIGH (ref 70–99)
POTASSIUM: 4.8 meq/L (ref 3.7–5.3)
Sodium: 139 mEq/L (ref 137–147)
TOTAL PROTEIN: 8.5 g/dL — AB (ref 6.0–8.3)

## 2013-11-12 LAB — URINALYSIS, ROUTINE W REFLEX MICROSCOPIC
Bilirubin Urine: NEGATIVE
GLUCOSE, UA: NEGATIVE mg/dL
KETONES UR: NEGATIVE mg/dL
Leukocytes, UA: NEGATIVE
NITRITE: NEGATIVE
PH: 5 (ref 5.0–8.0)
Protein, ur: NEGATIVE mg/dL
SPECIFIC GRAVITY, URINE: 1.025 (ref 1.005–1.030)
Urobilinogen, UA: 0.2 mg/dL (ref 0.0–1.0)

## 2013-11-12 LAB — URINE MICROSCOPIC-ADD ON

## 2013-11-12 MED ORDER — ONDANSETRON 8 MG PO TBDP
8.0000 mg | ORAL_TABLET | Freq: Once | ORAL | Status: AC
  Administered 2013-11-12: 8 mg via ORAL
  Filled 2013-11-12: qty 1

## 2013-11-12 MED ORDER — OXYCODONE-ACETAMINOPHEN 5-325 MG PO TABS: 1.0000 | ORAL_TABLET | Freq: Four times a day (QID) | ORAL | Status: DC | PRN

## 2013-11-12 MED ORDER — HYDROMORPHONE HCL PF 1 MG/ML IJ SOLN
0.5000 mg | INTRAMUSCULAR | Status: DC | PRN
  Administered 2013-11-12 (×2): 0.5 mg via INTRAVENOUS
  Filled 2013-11-12 (×2): qty 1

## 2013-11-12 MED ORDER — ONDANSETRON HCL 4 MG PO TABS: 4.0000 mg | ORAL_TABLET | Freq: Four times a day (QID) | ORAL | Status: DC

## 2013-11-12 MED ORDER — FLOMAX 0.4 MG PO CAPS: 0.4000 mg | ORAL_CAPSULE | Freq: Every day | ORAL | Status: DC

## 2013-11-12 MED ORDER — ONDANSETRON HCL 4 MG/2ML IJ SOLN
4.0000 mg | Freq: Once | INTRAMUSCULAR | Status: AC
  Administered 2013-11-12: 4 mg via INTRAVENOUS
  Filled 2013-11-12: qty 2

## 2013-11-12 NOTE — ED Notes (Signed)
Pt reports hx of kidney stones. Reports left flank pain 8/10 started at 1400, burning with urination. Vomited x2 since 1400.

## 2013-11-12 NOTE — Discharge Instructions (Signed)
Kidney Stones Kidney stones (urolithiasis) are solid masses that form inside your kidneys. The intense pain is caused by the stone moving through the kidney, ureter, bladder, and urethra (urinary tract). When the stone moves, the ureter starts to spasm around the stone. The stone is usually passed in your pee (urine).  HOME CARE  Drink enough fluids to keep your pee clear or pale yellow. This helps to get the stone out.  Strain all pee through the provided strainer. Do not pee without peeing through the strainer, not even once. If you pee the stone out, catch it in the strainer. The stone may be as small as a grain of salt. Take this to your doctor. This will help your doctor figure out what you can do to try to prevent more kidney stones.  Only take medicine as told by your doctor.  Follow up with your doctor as told.  Get follow-up X-rays as told by your doctor. GET HELP IF: You have pain that gets worse even if you have been taking pain medicine. GET HELP RIGHT AWAY IF:   Your pain does not get better with medicine.  You have a fever or shaking chills.  Your pain increases and gets worse over 18 hours.  You have new belly (abdominal) pain.  You feel faint or pass out.  You are unable to pee. MAKE SURE YOU:   Understand these instructions.  Will watch your condition.  Will get help right away if you are not doing well or get worse. Document Released: 11/13/2007 Document Revised: 01/27/2013 Document Reviewed: 10/28/2012 [employer] Patient Information 2014 [employer].  

## 2013-11-12 NOTE — ED Provider Notes (Signed)
CSN: [id number]     Arrival date & time 11/12/13  1857 History   First MD Initiated Contact with Patient 11/12/13 1945     Chief Complaint  Patient presents with  . Flank Pain    HPI Patient presents to the emergency room with complaints of left flank pain that started about 2 PM today. Patient states the pain is sharp and severe. It does not radiate. He has noticed some burning when he urinates. He's also had 2 episodes of vomiting since about 2 PM. Patient does have a history of prior kidney stones. This does feel similar. Patient is also concerned though because he has been coughing this past week. He does not feel short of breath. He has not noticed any fever he has not sure if the cough could be associated with this pain as well. Past Medical History  Diagnosis Date  . Depression   . GERD (gastroesophageal reflux disease)   . RLS (restless legs syndrome)   . Sleep disturbance     due to RLS  . Dyshidrotic eczema   . OA (osteoarthritis)   . Gastritis and duodenitis   . Heart failure     EF 40%, Echo 01-2009  . CAD (coronary artery disease)     Non obstructive (30-40% Circ, 40% RCA AM,2008)  . ED (erectile dysfunction)   . Renal disorder     kidney stones   Past Surgical History  Procedure Laterality Date  . Esophagogastric fundoplication  0569    scope-[facility]  . Kidney stone surgery  1997/2000  . Carpal tunnel release  05/2003    Right ([place])  . Ureteroscopy  05/2008    [facility]  . Cataract extraction  10/2008    Left  . Cataract extraction  11/2008    Right  . Laparoscopic cholecystectomy  04/2009    Dr. Johney Maine   Family History  Problem Relation Age of Onset  . Heart attack Father     4 total and bypass  . Cancer Mother     unknown type   History  Substance Use Topics  . Smoking status: Never Smoker   . Smokeless tobacco: Never Used  . Alcohol Use: Yes     Comment: occasional    Review of Systems  All other systems reviewed and are  negative.     Allergies  Penicillins  Home Medications   Prior to Admission medications   Medication Sig Start Date End Date Taking? Authorizing Provider  lisinopril (PRINIVIL,ZESTRIL) 10 MG tablet Take 10 mg by mouth daily.   Yes Historical Provider, MD  meloxicam (MOBIC) 15 MG tablet Take 15 mg by mouth daily.   Yes Historical Provider, MD  pantoprazole (PROTONIX) 40 MG tablet Take 40 mg by mouth daily.   Yes Historical Provider, MD  Pseudoeph-Doxylamine-DM-APAP 60-7.11-06-998 MG/30ML LIQD Take 30 mLs by mouth every 8 (eight) hours as needed (loss of voice and coughing).   Yes Historical Provider, MD  FLOMAX 0.4 MG CAPS capsule Take 1 capsule (0.4 mg total) by mouth daily. 11/12/13   Dorie Rank, MD  ondansetron (ZOFRAN) 4 MG tablet Take 1 tablet (4 mg total) by mouth every 6 (six) hours. 11/12/13   Dorie Rank, MD  oxyCODONE-acetaminophen (PERCOCET/ROXICET) 5-325 MG per tablet Take 1-2 tablets by mouth every 6 (six) hours as needed. 11/12/13   Dorie Rank, MD   BP 140/73  Pulse 66  Temp(Src) 98 F (36.7 C) (Oral)  Resp 20  SpO2 95% Physical Exam  Nursing note  and vitals reviewed. Constitutional: He appears well-developed and well-nourished. No distress.  HENT:  Head: Normocephalic and atraumatic.  Right Ear: External ear normal.  Left Ear: External ear normal.  Eyes: Conjunctivae are normal. Right eye exhibits no discharge. Left eye exhibits no discharge. No scleral icterus.  Neck: Neck supple. No tracheal deviation present.  Cardiovascular: Normal rate, regular rhythm and intact distal pulses.   Pulmonary/Chest: Effort normal and breath sounds normal. No stridor. No respiratory distress. He has no wheezes. He has no rales.  Abdominal: Soft. Bowel sounds are normal. He exhibits no distension. There is no tenderness. There is no rebound and no guarding.  Musculoskeletal: He exhibits no edema and no tenderness.  Neurological: He is alert. He has normal strength. No cranial nerve deficit  (no facial droop, extraocular movements intact, no slurred speech) or sensory deficit. He exhibits normal muscle tone. He displays no seizure activity. Coordination normal.  Skin: Skin is warm and dry. No rash noted.  Psychiatric: He has a normal mood and affect.    ED Course  Procedures (including critical care time) Labs Review Labs Reviewed  CBC WITH DIFFERENTIAL - Abnormal; Notable for the following:    WBC 11.4 (*)    Neutro Abs 8.1 (*)    All other components within normal limits  COMPREHENSIVE METABOLIC PANEL - Abnormal; Notable for the following:    Glucose, Bld 152 (*)    Creatinine, Ser 1.62 (*)    Total Protein 8.5 (*)    GFR calc non Af Amer 40 (*)    GFR calc Af Amer 46 (*)    All other components within normal limits  URINALYSIS, ROUTINE W REFLEX MICROSCOPIC - Abnormal; Notable for the following:    APPearance CLOUDY (*)    Hgb urine dipstick LARGE (*)    All other components within normal limits  URINE MICROSCOPIC-ADD ON - Abnormal; Notable for the following:    Bacteria, UA FEW (*)    Casts HYALINE CASTS (*)    All other components within normal limits  LIPASE, BLOOD  URINALYSIS, DIPSTICK ONLY    Imaging Review Ct Abdomen Pelvis Wo Contrast  11/12/2013   CLINICAL DATA:  Left flank pain and this urea with nausea and vomiting; history of urinary tract stones  EXAM: CT ABDOMEN AND PELVIS WITHOUT CONTRAST  TECHNIQUE: Multidetector CT imaging of the abdomen and pelvis was performed following the standard protocol without IV contrast.  COMPARISON:  CT scan of the abdomen and pelvis dated April 02, 2009.  FINDINGS: There is mild left-sided hydronephrosis secondary to a 4 mm diameter stone at the left ureteropelvic junction on image 47. There is a nonobstructing 5 mm diameter lower pole stone on the left. There is a 5.8 x 7 cm left lower pole hypodense mass with HU measurement of +2 consistent with a cyst. On the right there is no evidence of obstruction but there is a 3 mm  diameter midpole stone. The urinary bladder is partially distended and grossly normal. There are phleboliths within the pelvis. The prostate gland and seminal vesicles are normal for age.  The liver exhibits decreased density consistent with fatty infiltration. The gallbladder is surgically absent. The pancreas is atrophic and fatty infiltrated. The spleen, partially distended stomach, adrenal glands, and abdominal aorta are normal in appearance. There is a small hiatal hernia. The small and large bowel are normal.  There is degenerative disc space narrowing at multiple lumbar levels. There is no compression fracture. The bony pelvis is intact.  There is mild narrowing of the hip joints. The lung bases exhibit dependent atelectasis bilaterally.  IMPRESSION: 1. There is mild left-sided hydronephrosis secondary to a 4 mm diameter left UPJ stone. There are other nonobstructing stones on the left and there is a nonobstructing stone on the right. There is a cyst associated with the lower pole of the left kidney. 2. There is no acute abnormality elsewhere within the abdomen or pelvis.   Electronically Signed   By: David  Martinique   On: 11/12/2013 21:28   Dg Chest 2 View  11/12/2013   CLINICAL DATA:  Left flank pain.  Burning with urination.  EXAM: CHEST  2 VIEW  COMPARISON:  Chest x-ray 11/20/2013.  FINDINGS: Linear opacity in the lingula compatible subsegmental atelectasis or scarring. Lung volumes are low. No consolidative airspace disease. No pleural effusions. No pneumothorax. No pulmonary nodule or mass noted. Pulmonary vasculature and the cardiomediastinal silhouette are within normal limits.  IMPRESSION: 1. Low lung volumes without radiographic evidence of acute cardiopulmonary disease.   Electronically Signed   By: Vinnie Langton M.D.   On: 11/12/2013 20:38   Medications  HYDROmorphone (DILAUDID) injection 0.5 mg (0.5 mg Intravenous Given 11/12/13 2213)  ondansetron (ZOFRAN-ODT) disintegrating tablet 8 mg (8 mg  Oral Given 11/12/13 1917)  ondansetron (ZOFRAN) injection 4 mg (4 mg Intravenous Given 11/12/13 2052)     MDM   Final diagnoses:  Ureteral stone    Patient has a 4 mm ureteral stone on the left. He was given IV pain medications to help alleviate his pain. We'll discharge him home on Zofran, Flomax, and Percocet. He'll followup with [facility]   Dorie Rank, MD 11/12/13 2250

## 2013-11-14 ENCOUNTER — Other Ambulatory Visit: Payer: Self-pay | Admitting: Internal Medicine

## 2013-11-15 DIAGNOSIS — R82998 Other abnormal findings in urine: Secondary | ICD-10-CM | POA: Diagnosis not present

## 2013-11-15 DIAGNOSIS — R110 Nausea: Secondary | ICD-10-CM | POA: Diagnosis not present

## 2013-11-15 DIAGNOSIS — N200 Calculus of kidney: Secondary | ICD-10-CM | POA: Diagnosis not present

## 2013-11-15 DIAGNOSIS — N201 Calculus of ureter: Secondary | ICD-10-CM | POA: Diagnosis not present

## 2013-11-15 DIAGNOSIS — R109 Unspecified abdominal pain: Secondary | ICD-10-CM | POA: Diagnosis not present

## 2013-11-25 DIAGNOSIS — N201 Calculus of ureter: Secondary | ICD-10-CM | POA: Diagnosis not present

## 2013-11-25 DIAGNOSIS — R82998 Other abnormal findings in urine: Secondary | ICD-10-CM | POA: Diagnosis not present

## 2014-01-03 ENCOUNTER — Other Ambulatory Visit: Payer: Self-pay | Admitting: Internal Medicine

## 2014-02-20 ENCOUNTER — Other Ambulatory Visit: Payer: Self-pay | Admitting: Internal Medicine

## 2014-03-18 ENCOUNTER — Other Ambulatory Visit: Payer: Self-pay | Admitting: Internal Medicine

## 2014-06-11 DIAGNOSIS — M109 Gout, unspecified: Secondary | ICD-10-CM | POA: Diagnosis not present

## 2014-06-11 DIAGNOSIS — M79674 Pain in right toe(s): Secondary | ICD-10-CM | POA: Diagnosis not present

## 2014-12-05 ENCOUNTER — Other Ambulatory Visit: Payer: Self-pay

## 2014-12-20 ENCOUNTER — Encounter: Payer: Self-pay | Admitting: Gastroenterology

## 2015-03-02 DIAGNOSIS — L812 Freckles: Secondary | ICD-10-CM | POA: Diagnosis not present

## 2015-03-02 DIAGNOSIS — L821 Other seborrheic keratosis: Secondary | ICD-10-CM | POA: Diagnosis not present

## 2015-03-02 DIAGNOSIS — Z85828 Personal history of other malignant neoplasm of skin: Secondary | ICD-10-CM | POA: Diagnosis not present

## 2015-03-02 DIAGNOSIS — D229 Melanocytic nevi, unspecified: Secondary | ICD-10-CM | POA: Diagnosis not present

## 2015-03-02 DIAGNOSIS — L820 Inflamed seborrheic keratosis: Secondary | ICD-10-CM | POA: Diagnosis not present

## 2015-03-02 DIAGNOSIS — L578 Other skin changes due to chronic exposure to nonionizing radiation: Secondary | ICD-10-CM | POA: Diagnosis not present

## 2015-03-02 DIAGNOSIS — L570 Actinic keratosis: Secondary | ICD-10-CM | POA: Diagnosis not present

## 2016-06-06 ENCOUNTER — Ambulatory Visit: Payer: PRIVATE HEALTH INSURANCE | Admitting: Internal Medicine

## 2016-07-29 ENCOUNTER — Ambulatory Visit (INDEPENDENT_AMBULATORY_CARE_PROVIDER_SITE_OTHER): Payer: Medicare Other | Admitting: Internal Medicine

## 2016-07-29 ENCOUNTER — Encounter: Payer: Self-pay | Admitting: Internal Medicine

## 2016-07-29 VITALS — BP 132/84 | HR 60 | Temp 98.1°F | Ht 71.5 in | Wt 228.0 lb

## 2016-07-29 DIAGNOSIS — K219 Gastro-esophageal reflux disease without esophagitis: Secondary | ICD-10-CM | POA: Diagnosis not present

## 2016-07-29 DIAGNOSIS — Z23 Encounter for immunization: Secondary | ICD-10-CM

## 2016-07-29 DIAGNOSIS — I10 Essential (primary) hypertension: Secondary | ICD-10-CM

## 2016-07-29 DIAGNOSIS — I420 Dilated cardiomyopathy: Secondary | ICD-10-CM | POA: Diagnosis not present

## 2016-07-29 DIAGNOSIS — M159 Polyosteoarthritis, unspecified: Secondary | ICD-10-CM

## 2016-07-29 DIAGNOSIS — I255 Ischemic cardiomyopathy: Secondary | ICD-10-CM

## 2016-07-29 DIAGNOSIS — G479 Sleep disorder, unspecified: Secondary | ICD-10-CM | POA: Diagnosis not present

## 2016-07-29 NOTE — Patient Instructions (Signed)
It would be okay to try flexogenics or another glucosamine/chondroitin product to see if it helps your knees. If you haven't seen any improvement in 1-2 months, you should not continue it.

## 2016-07-29 NOTE — Assessment & Plan Note (Signed)
BP Readings from Last 3 Encounters:  07/29/16 132/84  11/12/13 134/76  07/14/12 122/70   Good control without meds Will use ACEI if goes up

## 2016-07-29 NOTE — Progress Notes (Signed)
Subjective:    Patient ID: Caleb Chavez, male    DOB: [birth year], 76 y.o.   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   No current facility-administered medications on file prior to visit.     Allergies  Allergen Reactions  . Penicillins     REACTION: unspecified    Past Medical History:  Diagnosis Date  . CAD (coronary artery disease)    Non obstructive (30-40% Circ, 40% RCA AM,2008)  . Depression   . Dyshidrotic eczema   . ED (erectile dysfunction)   . Gastritis and duodenitis   . GERD (gastroesophageal reflux disease)   . Heart failure    EF 40%, Echo 01-2009  . OA (osteoarthritis)   . Renal disorder    kidney stones  . RLS (restless legs syndrome)   . Sleep disturbance    due to RLS    Past Surgical History:  Procedure Laterality Date  . CARPAL TUNNEL RELEASE  05/2003   Right ([place])  . CATARACT EXTRACTION  10/2008   Left  . CATARACT EXTRACTION  11/2008   Right  . ESOPHAGOGASTRIC FUNDOPLICATION  99991111   scope-[facility]  . KIDNEY STONE SURGERY  1997/2000  . LAPAROSCOPIC CHOLECYSTECTOMY  04/2009    Dr. Johney Maine  . URETEROSCOPY  05/2008   [facility]    Family History  Problem Relation Age of Onset  . Heart attack Father     4 total and bypass  . Cancer Mother     unknown type    Social History   Social History  . Marital status: Married    Spouse name: N/A  . Number of children: 3  . Years of education: N/A   Occupational History  . 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 [place]--retired again   Social History Main Topics  . Smoking status: Never Smoker  . Smokeless tobacco: Never Used  . Alcohol use Yes     Comment: occasional  . Drug use: Unknown  . Sexual activity: Not on file   Other Topics Concern  . Not on file   Social History Narrative   Divorced then remarried 5/12   Has living will   Requests nephew [name] as his health care POA. Forms given for him to do (07/14/12)   Would accept resuscitation attempts   Probably would accept a feeding tube--at least for some time   Review of Systems  Constitutional: Negative for fatigue and unexpected weight change.       Back down to a better level  with his weight Wears seat belt  HENT: Positive for tinnitus. Negative for hearing loss.   Eyes: Negative for visual disturbance.       No diplopia or unilateral vision loss  Respiratory: Negative for cough, chest tightness and shortness of breath.   Cardiovascular: Negative for chest pain, palpitations and leg swelling.  Gastrointestinal: Negative for blood in stool and constipation.  Endocrine: Negative for polydipsia and polyuria.  Genitourinary: Negative for difficulty urinating, frequency and urgency.  Musculoskeletal: Positive for arthralgias. Negative for joint swelling.  Skin: Negative for rash.       Sores on left temple--- fairly chronic (several months). No pain  Allergic/Immunologic: Negative for environmental allergies and immunocompromised state.  Neurological: Negative for  dizziness, syncope, light-headedness and headaches.  Hematological: Negative for adenopathy. Does not bruise/bleed easily.  Psychiatric/Behavioral: Positive for sleep disturbance. Negative for dysphoric mood. The patient is not nervous/anxious.        Chronic sleep problems       Objective:   Physical Exam  Constitutional: He appears well-nourished. No distress.  Neck: No thyromegaly present.  Cardiovascular: Normal rate, regular rhythm, normal heart sounds and intact distal pulses.  Exam reveals no gallop.   No murmur heard. Pulmonary/Chest: Effort normal and breath sounds normal. No respiratory distress. He has no wheezes. He has no rales.  Abdominal: Soft. There is no tenderness.  Musculoskeletal: He exhibits no edema or tenderness.  Lymphadenopathy:    He has no cervical adenopathy.  Skin:  Well circumscribed hard lesion on right arm (stable for a year) and large seb keratosis in left temple hair  Psychiatric: He has a normal mood and affect. His behavior is normal.          Assessment & Plan:

## 2016-07-29 NOTE — Assessment & Plan Note (Signed)
Discussed "flexogenics" Just using tylenol and going to gym regularly now

## 2016-07-29 NOTE — Addendum Note (Signed)
Addended by: Pilar Grammes on: 07/29/2016 05:18 PM   Modules accepted: Orders

## 2016-07-29 NOTE — Assessment & Plan Note (Signed)
Chronic but doing okay without any Rx at this point

## 2016-07-29 NOTE — Assessment & Plan Note (Signed)
Has been better Off the PPI now

## 2016-07-29 NOTE — Assessment & Plan Note (Signed)
Known reduced EF in the past---but no active CHF now Rate is slow--- (60 or just below) so no beta blocker No aspirin due to GI problems with this

## 2016-07-29 NOTE — Progress Notes (Signed)
Pre visit review using our clinic review tool, if applicable. No additional management support is needed unless otherwise documented below in the visit note. 

## 2016-07-30 LAB — COMPREHENSIVE METABOLIC PANEL
ALK PHOS: 62 U/L (ref 39–117)
ALT: 15 U/L (ref 0–53)
AST: 17 U/L (ref 0–37)
Albumin: 4.6 g/dL (ref 3.5–5.2)
BUN: 14 mg/dL (ref 6–23)
CALCIUM: 9.8 mg/dL (ref 8.4–10.5)
CO2: 28 mEq/L (ref 19–32)
CREATININE: 1.21 mg/dL (ref 0.40–1.50)
Chloride: 105 mEq/L (ref 96–112)
GFR: 61.69 mL/min (ref >60.00)
GLUCOSE: 109 mg/dL — AB (ref 70–99)
Potassium: 4.4 mEq/L (ref 3.5–5.1)
Sodium: 139 mEq/L (ref 135–145)
TOTAL PROTEIN: 7.8 g/dL (ref 6.0–8.3)
Total Bilirubin: 1.5 mg/dL — ABNORMAL HIGH (ref 0.2–1.2)

## 2016-07-30 LAB — CBC WITH DIFFERENTIAL/PLATELET
BASOS ABS: 0.1 10*3/uL (ref 0.0–0.1)
Basophils Relative: 1.5 % (ref 0.0–3.0)
EOS ABS: 0.1 10*3/uL (ref 0.0–0.7)
Eosinophils Relative: 0.9 % (ref 0.0–5.0)
HCT: 45.4 % (ref 39.0–52.0)
Hemoglobin: 15.1 g/dL (ref 13.0–17.0)
LYMPHS ABS: 3.1 10*3/uL (ref 0.7–4.0)
LYMPHS PCT: 39.6 % (ref 12.0–46.0)
MCHC: 33.4 g/dL (ref 30.0–36.0)
MCV: 91.1 fl (ref 78.0–100.0)
Monocytes Absolute: 0.7 10*3/uL (ref 0.1–1.0)
Monocytes Relative: 9.5 % (ref 3.0–12.0)
NEUTROS PCT: 48.5 % (ref 43.0–77.0)
Neutro Abs: 3.7 10*3/uL (ref 1.4–7.7)
Platelets: 272 10*3/uL (ref 150.0–400.0)
RBC: 4.98 Mil/uL (ref 4.22–5.81)
RDW: 13.6 % (ref 11.5–15.5)
WBC: 7.7 10*3/uL (ref 4.0–10.5)

## 2016-07-30 LAB — LIPID PANEL
CHOLESTEROL: 137 mg/dL (ref 0–200)
HDL: 37.5 mg/dL — ABNORMAL LOW (ref >39.00)
LDL Cholesterol: 75 mg/dL (ref 0–99)
NonHDL: 99.9
TRIGLYCERIDES: 127 mg/dL (ref 0.0–149.0)
Total CHOL/HDL Ratio: 4
VLDL: 25.4 mg/dL (ref 0.0–40.0)

## 2017-06-23 ENCOUNTER — Ambulatory Visit (INDEPENDENT_AMBULATORY_CARE_PROVIDER_SITE_OTHER): Payer: Medicare Other | Admitting: Internal Medicine

## 2017-06-23 ENCOUNTER — Encounter: Payer: Self-pay | Admitting: Internal Medicine

## 2017-06-23 VITALS — BP 140/86 | HR 56 | Temp 97.9°F | Wt 226.0 lb

## 2017-06-23 DIAGNOSIS — Z23 Encounter for immunization: Secondary | ICD-10-CM

## 2017-06-23 DIAGNOSIS — L989 Disorder of the skin and subcutaneous tissue, unspecified: Secondary | ICD-10-CM | POA: Insufficient documentation

## 2017-06-23 MED ORDER — ZOLPIDEM TARTRATE 5 MG PO TABS: 5.0000 mg | ORAL_TABLET | Freq: Every evening | ORAL | 0 refills | Status: DC | PRN

## 2017-06-23 NOTE — Assessment & Plan Note (Signed)
This looks like a BSC. He will set up appt with Dr Carolanne Grumbling dermatologist

## 2017-06-23 NOTE — Progress Notes (Signed)
Subjective:    Patient ID: Caleb Chavez, male    DOB: January 05, 1941, 77 y.o.   MRN: [id number]  HPI Has had a spot on his left forearm he wants checked  Noticed it for some months Now seems bigger Tender when touched--feels sore No trauma  No Rx  Current Outpatient Medications on File Prior to Visit  Medication Sig Dispense Refill  . acetaminophen (TYLENOL 8 HOUR) 650 MG CR tablet Take 650 mg by mouth every 8 (eight) hours as needed for pain.     No current facility-administered medications on file prior to visit.     Allergies  Allergen Reactions  . Penicillins     REACTION: unspecified    Past Medical History:  Diagnosis Date  . CAD (coronary artery disease)    Non obstructive (30-40% Circ, 40% RCA AM,2008)  . Depression   . Dyshidrotic eczema   . ED (erectile dysfunction)   . Gastritis and duodenitis   . GERD (gastroesophageal reflux disease)   . Heart failure    EF 40%, Echo 01-2009  . OA (osteoarthritis)   . Renal disorder    kidney stones  . RLS (restless legs syndrome)   . Sleep disturbance    due to RLS    Past Surgical History:  Procedure Laterality Date  . CARPAL TUNNEL RELEASE  05/2003   Right ([place])  . CATARACT EXTRACTION  10/2008   Left  . CATARACT EXTRACTION  11/2008   Right  . ESOPHAGOGASTRIC FUNDOPLICATION  5035   scope-[facility]  . KIDNEY STONE SURGERY  1997/2000  . LAPAROSCOPIC CHOLECYSTECTOMY  04/2009   Dr. Johney Maine  . URETEROSCOPY  05/2008   [facility]    Family History  Problem Relation Age of Onset  . Heart attack Father        4 total and bypass  . Heart disease Father   . Cancer Mother        unknown type  . Arthritis Sister     Social History   Socioeconomic History  . Marital status: Married    Spouse name: Not on file  . Number of children: 3  . Years of education: Not on file  . Highest education level: Not on file  Social Needs  . Financial resource strain: Not on file  . Food insecurity - worry: Not on file  .  Food insecurity - inability: Not on file  . Transportation needs - medical: Not on file  . Transportation needs - non-medical: Not on file  Occupational History  . Occupation: Retired    Comment: Airline pilot. Counsellor  . Occupation: PARK MANAGER    Employer: [employer]    Comment: [place]--retired again  Tobacco Use  . Smoking status: Never Smoker  . Smokeless tobacco: Never Used  Substance and Sexual Activity  . Alcohol use: Yes    Comment: occasional  . Drug use: Not on file  . Sexual activity: Not on file  Other Topics Concern  . Not on file  Social History Narrative   Divorced then remarried 5/12   Has living will   Requests nephew [name] as his health care POA. Forms given for him to do (07/14/12)   Would accept resuscitation attempts   Probably would accept a feeding tube--at least for some time   Review of Systems  Has spot on right forearm--stable and no pain Not sick No fever Has long plane flight planned to [place] (asks for sleep medication)  Objective:   Physical Exam  Constitutional: No distress.  Skin:  52mm raised reddish lesion on left extensor forearm Slightly tender  4-52mm non inflamed lesion on right forearm          Assessment & Plan:

## 2017-06-23 NOTE — Addendum Note (Signed)
Addended by: Pilar Grammes on: 06/23/2017 11:29 AM   Modules accepted: Orders

## 2017-07-14 DIAGNOSIS — Z85828 Personal history of other malignant neoplasm of skin: Secondary | ICD-10-CM | POA: Diagnosis not present

## 2017-07-14 DIAGNOSIS — L570 Actinic keratosis: Secondary | ICD-10-CM | POA: Diagnosis not present

## 2017-07-14 DIAGNOSIS — L812 Freckles: Secondary | ICD-10-CM | POA: Diagnosis not present

## 2017-07-14 DIAGNOSIS — L821 Other seborrheic keratosis: Secondary | ICD-10-CM | POA: Diagnosis not present

## 2017-07-14 DIAGNOSIS — C44629 Squamous cell carcinoma of skin of left upper limb, including shoulder: Secondary | ICD-10-CM | POA: Diagnosis not present

## 2017-07-14 DIAGNOSIS — D485 Neoplasm of uncertain behavior of skin: Secondary | ICD-10-CM | POA: Diagnosis not present

## 2017-07-14 DIAGNOSIS — L578 Other skin changes due to chronic exposure to nonionizing radiation: Secondary | ICD-10-CM | POA: Diagnosis not present

## 2017-07-14 DIAGNOSIS — L820 Inflamed seborrheic keratosis: Secondary | ICD-10-CM | POA: Diagnosis not present

## 2017-07-30 ENCOUNTER — Encounter: Payer: Medicare Other | Admitting: Internal Medicine

## 2017-08-07 DIAGNOSIS — H6092 Unspecified otitis externa, left ear: Secondary | ICD-10-CM | POA: Diagnosis not present

## 2017-08-25 ENCOUNTER — Encounter: Payer: Self-pay | Admitting: Internal Medicine

## 2017-08-25 ENCOUNTER — Ambulatory Visit (INDEPENDENT_AMBULATORY_CARE_PROVIDER_SITE_OTHER): Payer: Medicare Other | Admitting: Internal Medicine

## 2017-08-25 VITALS — BP 132/78 | HR 61 | Temp 98.1°F | Wt 225.0 lb

## 2017-08-25 DIAGNOSIS — H6980 Other specified disorders of Eustachian tube, unspecified ear: Secondary | ICD-10-CM | POA: Insufficient documentation

## 2017-08-25 DIAGNOSIS — H6982 Other specified disorders of Eustachian tube, left ear: Secondary | ICD-10-CM | POA: Diagnosis not present

## 2017-08-25 DIAGNOSIS — H6990 Unspecified Eustachian tube disorder, unspecified ear: Secondary | ICD-10-CM | POA: Insufficient documentation

## 2017-08-25 HISTORY — DX: Unspecified eustachian tube disorder, unspecified ear: H69.90

## 2017-08-25 HISTORY — DX: Other specified disorders of Eustachian tube, unspecified ear: H69.80

## 2017-08-25 MED ORDER — PREDNISONE 20 MG PO TABS: 40.0000 mg | ORAL_TABLET | Freq: Every day | ORAL | 0 refills | Status: DC

## 2017-08-25 MED ORDER — DOXYCYCLINE HYCLATE 100 MG PO TABS: 100.0000 mg | ORAL_TABLET | Freq: Two times a day (BID) | ORAL | 0 refills | Status: DC

## 2017-08-25 NOTE — Assessment & Plan Note (Signed)
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

## 2017-08-25 NOTE — Progress Notes (Signed)
Subjective:    Patient ID: Caleb Chavez, male    DOB: 1940-10-05, 77 y.o.   MRN: [id number]  HPI Here due to respiratory symptoms  Has lost hearing in his left ear "giving me a fit"--- bad pain in ear Also, some congestion in chest and head This started after being on airplane ~2 weeks ago--had popping and earache Then just worsened after a few days Went to urgent care and got drops for his ear-- not really helpful  No fever Slight cough No SOB No chills or sweats  Current Outpatient Medications on File Prior to Visit  Medication Sig Dispense Refill  . acetaminophen (TYLENOL 8 HOUR) 650 MG CR tablet Take 650 mg by mouth every 8 (eight) hours as needed for pain.    Marland Kitchen zolpidem (AMBIEN) 5 MG tablet Take 1 tablet (5 mg total) by mouth at bedtime as needed for sleep. 4 tablet 0   No current facility-administered medications on file prior to visit.     Allergies  Allergen Reactions  . Penicillins     REACTION: unspecified    Past Medical History:  Diagnosis Date  . CAD (coronary artery disease)    Non obstructive (30-40% Circ, 40% RCA AM,2008)  . Depression   . Dyshidrotic eczema   . ED (erectile dysfunction)   . Gastritis and duodenitis   . GERD (gastroesophageal reflux disease)   . Heart failure    EF 40%, Echo 01-2009  . OA (osteoarthritis)   . Renal disorder    kidney stones  . RLS (restless legs syndrome)   . Sleep disturbance    due to RLS    Past Surgical History:  Procedure Laterality Date  . CARPAL TUNNEL RELEASE  05/2003   Right ([place])  . CATARACT EXTRACTION  10/2008   Left  . CATARACT EXTRACTION  11/2008   Right  . ESOPHAGOGASTRIC FUNDOPLICATION  6314   scope-[facility]  . KIDNEY STONE SURGERY  1997/2000  . LAPAROSCOPIC CHOLECYSTECTOMY  04/2009   Dr. Johney Maine  . URETEROSCOPY  05/2008   [facility]    Family History  Problem Relation Age of Onset  . Heart attack Father        4 total and bypass  . Heart disease Father   . Cancer Mother    unknown type  . Arthritis Sister     Social History   Socioeconomic History  . Marital status: Married    Spouse name: Not on file  . Number of children: 3  . Years of education: Not on file  . Highest education level: Not on file  Social Needs  . Financial resource strain: Not on file  . Food insecurity - worry: Not on file  . Food insecurity - inability: Not on file  . Transportation needs - medical: Not on file  . Transportation needs - non-medical: Not on file  Occupational History  . Occupation: Retired    Comment: Airline pilot. Counsellor  . Occupation: PARK MANAGER    Employer: [employer]    Comment: [place]--retired again  Tobacco Use  . Smoking status: Never Smoker  . Smokeless tobacco: Never Used  Substance and Sexual Activity  . Alcohol use: Yes    Comment: occasional  . Drug use: Not on file  . Sexual activity: Not on file  Other Topics Concern  . Not on file  Social History Narrative   Divorced then remarried 5/12   Has living will   Requests nephew  [name] as his health care POA. Forms given for him to do (07/14/12)   Would accept resuscitation attempts   Probably would accept a feeding tube--at least for some time   Review of Systems  No vomiting or diarrhea No dizziness  Eating okay Some tinnitus---new     Objective:   Physical Exam  HENT:  Mouth/Throat: Oropharynx is clear and moist. No oropharyngeal exudate.  Small canal on left. TM obscured with cerumen Right is normal Mild nasal congestion  Neck: No thyromegaly present.  Pulmonary/Chest: Effort normal and breath sounds normal. No respiratory distress. He has no wheezes. He has no rales.  Lymphadenopathy:    He has no cervical adenopathy.          Assessment & Plan:

## 2017-08-25 NOTE — Patient Instructions (Signed)
Let me know if you are not better by the beginning of next week---I will set you up with an ENT specialist.

## 2017-09-23 ENCOUNTER — Ambulatory Visit (INDEPENDENT_AMBULATORY_CARE_PROVIDER_SITE_OTHER): Payer: Medicare Other | Admitting: Internal Medicine

## 2017-09-23 ENCOUNTER — Encounter: Payer: Self-pay | Admitting: Internal Medicine

## 2017-09-23 VITALS — BP 136/72 | HR 67 | Temp 97.7°F | Ht 71.25 in | Wt 226.5 lb

## 2017-09-23 DIAGNOSIS — M159 Polyosteoarthritis, unspecified: Secondary | ICD-10-CM | POA: Diagnosis not present

## 2017-09-23 DIAGNOSIS — I255 Ischemic cardiomyopathy: Secondary | ICD-10-CM | POA: Diagnosis not present

## 2017-09-23 DIAGNOSIS — Z1211 Encounter for screening for malignant neoplasm of colon: Secondary | ICD-10-CM

## 2017-09-23 DIAGNOSIS — Z7189 Other specified counseling: Secondary | ICD-10-CM | POA: Diagnosis not present

## 2017-09-23 DIAGNOSIS — I10 Essential (primary) hypertension: Secondary | ICD-10-CM | POA: Diagnosis not present

## 2017-09-23 DIAGNOSIS — G479 Sleep disorder, unspecified: Secondary | ICD-10-CM

## 2017-09-23 DIAGNOSIS — Z0000 Encounter for general adult medical examination without abnormal findings: Secondary | ICD-10-CM | POA: Diagnosis not present

## 2017-09-23 DIAGNOSIS — I420 Dilated cardiomyopathy: Secondary | ICD-10-CM | POA: Diagnosis not present

## 2017-09-23 LAB — COMPREHENSIVE METABOLIC PANEL
ALT: 16 U/L (ref 0–53)
AST: 18 U/L (ref 0–37)
Albumin: 4.2 g/dL (ref 3.5–5.2)
Alkaline Phosphatase: 71 U/L (ref 39–117)
BILIRUBIN TOTAL: 1 mg/dL (ref 0.2–1.2)
BUN: 16 mg/dL (ref 6–23)
CO2: 26 mEq/L (ref 19–32)
Calcium: 9.2 mg/dL (ref 8.4–10.5)
Chloride: 104 mEq/L (ref 96–112)
Creatinine, Ser: 1.06 mg/dL (ref 0.40–1.50)
GFR: 71.65 mL/min (ref >60.00)
GLUCOSE: 118 mg/dL — AB (ref 70–99)
Potassium: 4.5 mEq/L (ref 3.5–5.1)
Sodium: 137 mEq/L (ref 135–145)
Total Protein: 7.6 g/dL (ref 6.0–8.3)

## 2017-09-23 LAB — CBC
HEMATOCRIT: 42.8 % (ref 39.0–52.0)
Hemoglobin: 14.2 g/dL (ref 13.0–17.0)
MCHC: 33.2 g/dL (ref 30.0–36.0)
MCV: 92 fl (ref 78.0–100.0)
Platelets: 313 10*3/uL (ref 150.0–400.0)
RBC: 4.65 Mil/uL (ref 4.22–5.81)
RDW: 13.8 % (ref 11.5–15.5)
WBC: 7.8 10*3/uL (ref 4.0–10.5)

## 2017-09-23 LAB — T4, FREE: Free T4: 0.81 ng/dL (ref 0.60–1.60)

## 2017-09-23 NOTE — Assessment & Plan Note (Signed)
I have personally reviewed the Medicare Annual Wellness questionnaire and have noted 1. The patient's medical and social history 2. Their use of alcohol, tobacco or illicit drugs 3. Their current medications and supplements 4. The patient's functional ability including ADL's, fall risks, home safety risks and hearing or visual             impairment. 5. Diet and physical activities 6. Evidence for depression or mood disorders  The patients weight, height, BMI and visual acuity have been recorded in the chart I have made referrals, counseling and provided education to the patient based review of the above and I have provided the pt with a written personalized care plan for preventive services.  I have provided you with a copy of your personalized plan for preventive services. Please take the time to review along with your updated medication list.  Yearly flu vaccine Discussed --- will check FIT No PSA due to age 77 to walk regularly---discussed resistance training

## 2017-09-23 NOTE — Assessment & Plan Note (Signed)
BP Readings from Last 3 Encounters:  09/23/17 136/72  08/25/17 132/78  06/23/17 140/86   Fine without Rx Consider ACEI if EF still low

## 2017-09-23 NOTE — Assessment & Plan Note (Signed)
Does okay with tylenol 

## 2017-09-23 NOTE — Progress Notes (Signed)
Hearing Screening   125Hz  250Hz  500Hz  1000Hz  2000Hz  3000Hz  4000Hz  6000Hz  8000Hz   Right ear:   25 25 25   40    Left ear:   25 40 25  25      Visual Acuity Screening   Right eye Left eye Both eyes  Without correction: 20/40 20/30 20/25   With correction:

## 2017-09-23 NOTE — Assessment & Plan Note (Signed)
Doing okay without Rx

## 2017-09-23 NOTE — Progress Notes (Signed)
Subjective:    Patient ID: Caleb Chavez, male    DOB: July 27, 1940, 77 y.o.   MRN: [id number]  HPI Here for Medicare wellness visit and follow up of chronic medical conditions Reviewed form and advanced directives Reviewed other doctors No alcohol or tobacco Tries to walk regularly Vision is fine Mild hearing problems No falls No depression or anhedonic Independent with instrumental ADLs No sig memory issues  Feels well No chest pain No palpitations Exercise tolerance--- no sig DOE No PND or sig nocturia No edema  Chronic arthritis pain Does well with tylenol arthritis Takes 3-4 a day--and rarely a 5th one  Current Outpatient Medications on File Prior to Visit  Medication Sig Dispense Refill  . acetaminophen (TYLENOL 8 HOUR) 650 MG CR tablet Take 650 mg by mouth every 8 (eight) hours as needed for pain.     No current facility-administered medications on file prior to visit.     Allergies  Allergen Reactions  . Penicillins     REACTION: unspecified    Past Medical History:  Diagnosis Date  . CAD (coronary artery disease)    Non obstructive (30-40% Circ, 40% RCA AM,2008)  . Depression   . Dyshidrotic eczema   . ED (erectile dysfunction)   . Gastritis and duodenitis   . GERD (gastroesophageal reflux disease)   . Heart failure    EF 40%, Echo 01-2009  . OA (osteoarthritis)   . Renal disorder    kidney stones  . RLS (restless legs syndrome)   . Sleep disturbance    due to RLS    Past Surgical History:  Procedure Laterality Date  . CARPAL TUNNEL RELEASE  05/2003   Right ([place])  . CATARACT EXTRACTION  10/2008   Left  . CATARACT EXTRACTION  11/2008   Right  . ESOPHAGOGASTRIC FUNDOPLICATION  2992   scope-[facility]  . KIDNEY STONE SURGERY  1997/2000  . LAPAROSCOPIC CHOLECYSTECTOMY  04/2009   Dr. Johney Maine  . URETEROSCOPY  05/2008   [facility]    Family History  Problem Relation Age of Onset  . Heart attack Father        4 total and bypass  . Heart  disease Father   . Cancer Mother        unknown type  . Arthritis Sister     Social History   Socioeconomic History  . Marital status: Married    Spouse name: Not on file  . Number of children: 3  . Years of education: Not on file  . Highest education level: Not on file  Occupational History  . Occupation: Retired    Comment: Airline pilot. Counsellor  . Occupation: PARK MANAGER    Employer: [employer]    Comment: [place] [employer]--part time  Social Needs  . Financial resource strain: Not on file  . Food insecurity:    Worry: Not on file    Inability: Not on file  . Transportation needs:    Medical: Not on file    Non-medical: Not on file  Tobacco Use  . Smoking status: Never Smoker  . Smokeless tobacco: Never Used  Substance and Sexual Activity  . Alcohol use: Yes    Comment: occasional  . Drug use: Not on file  . Sexual activity: Not on file  Lifestyle  . Physical activity:    Days per week: Not on file    Minutes per session: Not on file  . Stress: Not on file  Relationships  .  Social connections:    Talks on phone: Not on file    Gets together: Not on file    Attends religious service: Not on file    Active member of club or organization: Not on file    Attends meetings of clubs or organizations: Not on file    Relationship status: Not on file  . Intimate partner violence:    Fear of current or ex partner: Not on file    Emotionally abused: Not on file    Physically abused: Not on file    Forced sexual activity: Not on file  Other Topics Concern  . Not on file  Social History Narrative   Divorced then remarried 5/12   Has living will   Requests wife then  nephew [name] as his health care POA.    Would accept resuscitation attempts   Probably would accept a feeding tube--at least for some time   Review of Systems Never been a great sleeper--up 2-3 times a night. Ear did get better--just slight tinnitus Appetite is  fine Weight stable No heartburn or dysphagia Bowels are fine. No blood Voids fine. Stream is good Keeps up with Dr Ammie Ferrier some actinics Teeth okay. Keeps up with dentist    Objective:   Physical Exam  Constitutional: He is oriented to person, place, and time. He appears well-developed. No distress.  HENT:  Mouth/Throat: Oropharynx is clear and moist. No oropharyngeal exudate.  Neck: No thyromegaly present.  Cardiovascular: Normal rate, regular rhythm, normal heart sounds and intact distal pulses. Exam reveals no gallop.  No murmur heard. Distant heart sounds  Pulmonary/Chest: Effort normal and breath sounds normal. No respiratory distress. He has no wheezes. He has no rales.  Abdominal: Soft. There is no tenderness.  Musculoskeletal: He exhibits no edema or tenderness.  Lymphadenopathy:    He has no cervical adenopathy.  Neurological: He is alert and oriented to person, place, and time.  President--- "Milinda Pointer, Obama, ?" [phone] D-l-[id number] Recall 2/3   Skin: No rash noted. No erythema.  Psychiatric: He has a normal mood and affect. His behavior is normal.          Assessment & Plan:

## 2017-09-23 NOTE — Assessment & Plan Note (Signed)
Past mild reduced EF No symptoms of CHF Will recheck echo

## 2017-09-23 NOTE — Assessment & Plan Note (Signed)
See social history 

## 2017-10-07 ENCOUNTER — Other Ambulatory Visit: Payer: Medicare Other

## 2017-10-10 ENCOUNTER — Other Ambulatory Visit (INDEPENDENT_AMBULATORY_CARE_PROVIDER_SITE_OTHER): Payer: Medicare Other

## 2017-10-10 DIAGNOSIS — Z1211 Encounter for screening for malignant neoplasm of colon: Secondary | ICD-10-CM | POA: Diagnosis not present

## 2017-10-10 LAB — FECAL OCCULT BLOOD, IMMUNOCHEMICAL: Fecal Occult Bld: POSITIVE — AB

## 2017-10-28 ENCOUNTER — Telehealth: Payer: Self-pay | Admitting: Internal Medicine

## 2017-10-28 NOTE — Telephone Encounter (Signed)
Copied from Reader [id number]. Topic: Quick Communication - See Telephone Encounter >> Oct 28, 2017  4:05 PM Bea Graff, NT wrote: CRM for notification. See Telephone encounter for: 10/28/17. Pt returning call to the office from 10/13/17 to receive results.

## 2017-10-29 NOTE — Telephone Encounter (Signed)
Left message for patient to return call- see result note in chart.

## 2017-10-30 ENCOUNTER — Other Ambulatory Visit: Payer: Self-pay | Admitting: Internal Medicine

## 2017-10-30 DIAGNOSIS — R195 Other fecal abnormalities: Secondary | ICD-10-CM

## 2017-10-31 ENCOUNTER — Encounter: Payer: Self-pay | Admitting: Gastroenterology

## 2017-12-25 ENCOUNTER — Emergency Department (HOSPITAL_COMMUNITY): Payer: Medicare Other

## 2017-12-25 ENCOUNTER — Encounter (HOSPITAL_COMMUNITY): Payer: Self-pay | Admitting: Emergency Medicine

## 2017-12-25 ENCOUNTER — Other Ambulatory Visit: Payer: Self-pay

## 2017-12-25 ENCOUNTER — Emergency Department (HOSPITAL_COMMUNITY)
Admission: EM | Admit: 2017-12-25 | Discharge: 2017-12-25 | Disposition: A | Discharge status: Home / Self Care | Payer: Medicare Other | Attending: Emergency Medicine | Admitting: Emergency Medicine

## 2017-12-25 DIAGNOSIS — R110 Nausea: Secondary | ICD-10-CM | POA: Diagnosis not present

## 2017-12-25 DIAGNOSIS — I10 Essential (primary) hypertension: Secondary | ICD-10-CM | POA: Diagnosis not present

## 2017-12-25 DIAGNOSIS — R1030 Lower abdominal pain, unspecified: Secondary | ICD-10-CM | POA: Diagnosis not present

## 2017-12-25 DIAGNOSIS — I2510 Atherosclerotic heart disease of native coronary artery without angina pectoris: Secondary | ICD-10-CM | POA: Insufficient documentation

## 2017-12-25 DIAGNOSIS — Z79899 Other long term (current) drug therapy: Secondary | ICD-10-CM | POA: Diagnosis not present

## 2017-12-25 DIAGNOSIS — N132 Hydronephrosis with renal and ureteral calculous obstruction: Secondary | ICD-10-CM | POA: Diagnosis not present

## 2017-12-25 DIAGNOSIS — N200 Calculus of kidney: Secondary | ICD-10-CM

## 2017-12-25 DIAGNOSIS — R109 Unspecified abdominal pain: Secondary | ICD-10-CM | POA: Diagnosis present

## 2017-12-25 LAB — URINALYSIS, ROUTINE W REFLEX MICROSCOPIC
Bacteria, UA: NONE SEEN
Bilirubin Urine: NEGATIVE
Glucose, UA: NEGATIVE mg/dL
Ketones, ur: 5 mg/dL — AB
Leukocytes, UA: NEGATIVE
Nitrite: NEGATIVE
PROTEIN: NEGATIVE mg/dL
Specific Gravity, Urine: 1.019 (ref 1.005–1.030)
pH: 5 (ref 5.0–8.0)

## 2017-12-25 MED ORDER — TAMSULOSIN HCL 0.4 MG PO CAPS: 0.4000 mg | ORAL_CAPSULE | Freq: Every day | ORAL | 0 refills | Status: DC

## 2017-12-25 MED ORDER — HYDROCODONE-ACETAMINOPHEN 5-325 MG PO TABS
1.0000 | ORAL_TABLET | Freq: Once | ORAL | Status: AC
  Administered 2017-12-25: 1 via ORAL
  Filled 2017-12-25: qty 1

## 2017-12-25 MED ORDER — HYDROCODONE-ACETAMINOPHEN 5-325 MG PO TABS: 1.0000 | ORAL_TABLET | Freq: Four times a day (QID) | ORAL | 0 refills | Status: DC | PRN

## 2017-12-25 MED ORDER — ONDANSETRON HCL 4 MG PO TABS: 4.0000 mg | ORAL_TABLET | Freq: Three times a day (TID) | ORAL | 0 refills | Status: DC | PRN

## 2017-12-25 MED ORDER — ONDANSETRON 4 MG PO TBDP
4.0000 mg | ORAL_TABLET | Freq: Once | ORAL | Status: AC
  Administered 2017-12-25: 4 mg via ORAL
  Filled 2017-12-25: qty 1

## 2017-12-25 MED ORDER — IBUPROFEN 600 MG PO TABS: 600.0000 mg | ORAL_TABLET | Freq: Four times a day (QID) | ORAL | 0 refills | Status: DC | PRN

## 2017-12-25 NOTE — Discharge Instructions (Signed)
Take ibuprofen 3-4 times a day with meals.  Do not take other anti-inflammatories at the same time open (Advil, Motrin, naproxen, Aleve). You may supplement with norco as needed for severe or breakthrough pain. Do not drive if taking norco.  Use zofran as needed for nausea or vomiting.  Take flomax daily.  Use the urine strainer to try to catch the stone.  Make sure you are staying well hydrated with water. Your urine should be clear to pale yellow. Follow up with [facility] for further evaluation and management.  Return to the ER if you develop fevers, inability to urinate, severe pain not controlled with medication, burning with urination, or any new or concerning symptoms.

## 2017-12-25 NOTE — ED Notes (Signed)
Patient transported to CT 

## 2017-12-25 NOTE — ED Notes (Signed)
Pt will try to provide a urine sample.

## 2017-12-25 NOTE — ED Provider Notes (Signed)
[facility] EMERGENCY DEPARTMENT Provider Note   CSN: [id number] Arrival date & time: 12/25/17  1404     History   Chief Complaint Chief Complaint  Patient presents with  . Flank Pain    HPI Caleb Chavez is a 77 y.o. male presenting for evaluation of left flank pain.  Patient states around 8:00 am, he felt some echoes of pain of his right flank.  Around 10 AM, he had acute onset severe pain.  He states pain is colicky and sharp.  He has associated nausea without vomiting.  He denies fevers, chills, chest pain, shortness of breath, anterior abdominal pain.  He has a history of kidney stones, states this feels similar.  [facility] sees [facility].  He has no other medical problems other than arthritis for which he takes Tylenol, no other medications.  No history of kidney injury.  He denies dysuria, hematuria, urinary frequency, or decreased urination.  States his pain was constant for several hours, has eased off a little since being in the ER.  He took his 2 Tylenol this morning without significant improvement, no other pain control.  He denies radiation of the pain.  Nothing makes it better or worse.  No association with urination.  Had a normal bowel movement this morning.  HPI  Past Medical History:  Diagnosis Date  . CAD (coronary artery disease)    Non obstructive (30-40% Circ, 40% RCA AM,2008)  . Depression   . Dyshidrotic eczema   . ED (erectile dysfunction)   . Gastritis and duodenitis   . GERD (gastroesophageal reflux disease)   . Heart failure    EF 40%, Echo 01-2009  . OA (osteoarthritis)   . Renal disorder    kidney stones  . RLS (restless legs syndrome)   . Sleep disturbance    due to RLS    Patient Active Problem List   Diagnosis Date Noted  . Advance directive discussed with patient 09/23/2017  . Eustachian tube dysfunction 08/25/2017  . Routine general medical examination at a health care facility 07/14/2012  . Actinic keratosis  07/14/2012  . ED (erectile dysfunction)   . ESSENTIAL HYPERTENSION, BENIGN 06/27/2009  . HELICOBACTER PYLORI GASTRITIS, HX OF 03/07/2009  . Ischemic dilated cardiomyopathy ([place]) 01/25/2009  . Osteoarthritis, 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/07/2007  . RESTLESS LEG SYNDROME 10/20/2006  . GERD 10/20/2006  . DYSHIDROTIC ECZEMA 10/20/2006  . Sleep disturbance 10/20/2006    Past Surgical History:  Procedure Laterality Date  . CARPAL TUNNEL RELEASE  05/2003   Right ([place])  . CATARACT EXTRACTION  10/2008   Left  . CATARACT EXTRACTION  11/2008   Right  . ESOPHAGOGASTRIC FUNDOPLICATION  6948   scope-[facility]  . KIDNEY STONE SURGERY  1997/2000  . LAPAROSCOPIC CHOLECYSTECTOMY  04/2009   Dr. Johney Maine  . URETEROSCOPY  05/2008   [facility]        Home Medications    Prior to Admission medications   Medication Sig Start Date End Date Taking? Authorizing Provider  acetaminophen (TYLENOL 8 HOUR) 650 MG CR tablet Take 650 mg by mouth every 8 (eight) hours as needed for pain.    [provider]  HYDROcodone-acetaminophen (NORCO/VICODIN) 5-325 MG tablet Take 1 tablet by mouth every 6 (six) hours as needed for severe pain. 12/25/17   Savina Olshefski, PA-C  ibuprofen (ADVIL,MOTRIN) 600 MG tablet Take 1 tablet (600 mg total) by mouth every 6 (six) hours as needed. 12/25/17   Jessabelle Markiewicz, PA-C  ondansetron (ZOFRAN) 4 MG tablet  Take 1 tablet (4 mg total) by mouth every 8 (eight) hours as needed for nausea or vomiting. 12/25/17   Ashlye Oviedo, PA-C  tamsulosin (FLOMAX) 0.4 MG CAPS capsule Take 1 capsule (0.4 mg total) by mouth daily. 12/25/17   Jaz Mallick, PA-C    Family History Family History  Problem Relation Age of Onset  . Heart attack Father        4 total and bypass  . Heart disease Father   . Cancer Mother        unknown type  . Arthritis Sister     Social History Social History   Tobacco Use  . Smoking status: Never Smoker  . Smokeless tobacco: Never Used  Substance Use  Topics  . Alcohol use: Yes    Comment: occasional  . Drug use: Not on file     Allergies   Penicillins   Review of Systems Review of Systems  Gastrointestinal: Positive for nausea.  Genitourinary: Positive for flank pain.  All other systems reviewed and are negative.    Physical Exam Updated Vital Signs BP 124/80 (BP Location: Right Arm)   Pulse 75   Temp 98.2 F (36.8 C) (Oral)   Resp 20   SpO2 95%   Physical Exam  Constitutional: He is oriented to person, place, and time. He appears well-developed and well-nourished. No distress.  Elderly male, appears in no distress  HENT:  Head: Normocephalic and atraumatic.  Eyes: Pupils are equal, round, and reactive to light. Conjunctivae and EOM are normal.  Neck: Normal range of motion. Neck supple.  Cardiovascular: Normal rate, regular rhythm and intact distal pulses.  Pulmonary/Chest: Effort normal and breath sounds normal. No respiratory distress. He has no wheezes.  Abdominal: Soft. Bowel sounds are normal. He exhibits no distension and no mass. There is tenderness. There is CVA tenderness. There is no rigidity, no rebound, no guarding and negative Murphy's sign.  Right CVA/low back pain.  Mild irritation with palpation of the right lower quadrant.  No rebound.  No rigidity, guarding, or distention.  Musculoskeletal: Normal range of motion.  Neurological: He is alert and oriented to person, place, and time.  Skin: Skin is warm and dry.  Psychiatric: He has a normal mood and affect.  Nursing note and vitals reviewed.    ED Treatments / Results  Labs (all labs ordered are listed, but only abnormal results are displayed) Labs Reviewed  URINALYSIS, ROUTINE W REFLEX MICROSCOPIC - Abnormal; Notable for the following components:      Result Value   Hgb urine dipstick MODERATE (*)    Ketones, ur 5 (*)    All other components within normal limits  URINE CULTURE    EKG None  Radiology Ct Renal Stone Study  Result  Date: 12/25/2017 CLINICAL DATA:  Right flank pain today EXAM: CT ABDOMEN AND PELVIS WITHOUT CONTRAST TECHNIQUE: Multidetector CT imaging of the abdomen and pelvis was performed following the standard protocol without IV contrast. COMPARISON:  November 12, 2013 FINDINGS: Lower chest: Mild atelectasis of the left lung base is noted. The heart size is enlarged. Hepatobiliary: Diffuse low density of the liver is identified without vessel displacement. Consistent with fatty infiltration of liver. Patient status post prior cholecystectomy. The biliary tree is normal. Pancreas: The pancreas is atrophic without focal abnormality. Spleen: Normal in size without focal abnormality. Adrenals/Urinary Tract: There is right hydroureteronephrosis with right perinephric stranding due to obstruction by 4 mm stone in the mid to distal right ureter. Left renal  lower pole calyceal stone is identified. There are cysts within the left kidney. There is no left hydronephrosis. The bladder is decompressed limiting evaluation. Stomach/Bowel: There is a small hiatal hernia hernia. The stomach is otherwise normal. There is no diverticulitis or small bowel obstruction. The appendix is not seen but no inflammation is noted around cecum. Vascular/Lymphatic: Aortic atherosclerosis. No enlarged abdominal or pelvic lymph nodes. Reproductive: Prostate calcifications are noted. Other: None. Musculoskeletal: Degenerative joint changes of the spine are noted. IMPRESSION: Right hydroureteronephrosis due to obstruction by a 4 mm stone in the mid to distal right ureter. Electronically Signed   By: Abelardo Diesel M.D.   On: 12/25/2017 20:34    Procedures Procedures (including critical care time)  Medications Ordered in ED Medications  HYDROcodone-acetaminophen (NORCO/VICODIN) 5-325 MG per tablet 1 tablet (1 tablet Oral Given 12/25/17 1920)  ondansetron (ZOFRAN-ODT) disintegrating tablet 4 mg (4 mg Oral Given 12/25/17 1920)     Initial Impression /  Assessment and Plan / ED Course  I have reviewed the triage vital signs and the nursing notes.  Pertinent labs & imaging results that were available during my care of the patient were reviewed by me and considered in my medical decision making (see chart for details).     Pt presenting for evaluation of right flank pain.  Physical exam reassuring, he is afebrile not tachycardic.  Appears nontoxic.  History of kidney stones, states this feels similar.  Will obtain urine and CT renal, and treat symptomatically and reassess.  Urine shows moderate blood, no infection.  Urine culture sent.  CT renal shows 4 mm mid ureteral stone.  Symptoms controlled with oral medication.  Will discharge on NSAIDs, Norco, Zofran, and Flomax.  Urine strainer given.  Follow-up with urology for further evaluation and management.  Discussed importance of hydration.  Strict return precautions given.  At this time, patient appears safe for discharge.  Return precautions given.  Patient states he understands agrees plan.   Final Clinical Impressions(s) / ED Diagnoses   Final diagnoses:  Right kidney stone    ED Discharge Orders        Ordered    tamsulosin (FLOMAX) 0.4 MG CAPS capsule  Daily     12/25/17 2110    ibuprofen (ADVIL,MOTRIN) 600 MG tablet  Every 6 hours PRN     12/25/17 2110    HYDROcodone-acetaminophen (NORCO/VICODIN) 5-325 MG tablet  Every 6 hours PRN     12/25/17 2110    ondansetron (ZOFRAN) 4 MG tablet  Every 8 hours PRN     12/25/17 2110       Phillips Goulette, PA-C 12/25/17 2129    Charlesetta Shanks, MD 01/10/18 2313

## 2017-12-25 NOTE — ED Notes (Signed)
Pt ambulated to the bathroom with steady gait.

## 2017-12-25 NOTE — ED Triage Notes (Signed)
Patient complains of right flank pain that started today at 0900. History of kidney stones, states this feels exactly like previous stones. Denies hematuria. Patient alert, oriented, and ambulating independently with steady gait.

## 2017-12-25 NOTE — ED Notes (Signed)
Pt ready for discharge VS dopcumented

## 2017-12-25 NOTE — ED Notes (Signed)
Pt in room

## 2017-12-27 LAB — URINE CULTURE: Culture: 10000 — AB

## 2017-12-28 ENCOUNTER — Telehealth: Payer: Self-pay

## 2017-12-28 NOTE — Progress Notes (Signed)
ED Antimicrobial Stewardship Positive Culture Follow Up   Caleb Chavez is an 77 y.o. male who presented to [facility] on 12/25/2017 with a chief complaint of  Chief Complaint  Patient presents with  . Flank Pain    Recent Results (from the past 720 hour(s))  Urine culture     Status: Abnormal   Collection Time: 12/25/17  7:16 PM  Result Value Ref Range Status   Specimen Description URINE, CLEAN CATCH  Final   Special Requests NONE  Final   Culture (A)  Final    10,000 COLONIES/mL GROUP B STREP(S.AGALACTIAE)ISOLATED TESTING AGAINST S. AGALACTIAE NOT ROUTINELY PERFORMED DUE TO PREDICTABILITY OF AMP/PEN/VAN SUSCEPTIBILITY. Performed at [facility], [place] [address], [place], [place] [address]    Report Status 12/27/2017 FINAL  Final   No abx indicated  ED Provider: Langston Masker, PA-C  Wynell Balloon 12/28/2017, 9:14 AM Clinical Pharmacist Monday - Friday phone -  [phone] Saturday - Sunday phone - [phone]

## 2017-12-28 NOTE — Telephone Encounter (Signed)
No abx for UC in ED 12/25/17 per [facility]

## 2017-12-31 ENCOUNTER — Telehealth: Payer: Self-pay

## 2018-01-05 NOTE — Telephone Encounter (Signed)
Have left general messages X 2 to reach patient for update on condition.

## 2018-01-09 ENCOUNTER — Ambulatory Visit (INDEPENDENT_AMBULATORY_CARE_PROVIDER_SITE_OTHER): Payer: Medicare Other | Admitting: Gastroenterology

## 2018-01-09 ENCOUNTER — Telehealth: Payer: Self-pay | Admitting: Internal Medicine

## 2018-01-09 ENCOUNTER — Telehealth: Payer: Self-pay

## 2018-01-09 ENCOUNTER — Encounter: Payer: Self-pay | Admitting: Gastroenterology

## 2018-01-09 VITALS — BP 132/76 | HR 74 | Ht 74.0 in | Wt 223.5 lb

## 2018-01-09 DIAGNOSIS — K2980 Duodenitis without bleeding: Secondary | ICD-10-CM

## 2018-01-09 DIAGNOSIS — Z1211 Encounter for screening for malignant neoplasm of colon: Secondary | ICD-10-CM

## 2018-01-09 DIAGNOSIS — K921 Melena: Secondary | ICD-10-CM

## 2018-01-09 NOTE — Progress Notes (Signed)
Review of pertinent gastrointestinal problems: 1. Severe gastritis, duodenitis: presented with epigastric discomfort, nausea to the hospital August 2010. EGD revealed Severe gastritis, duodenitis, biopsies were H. pylori negative. He was recommended to avoid NSAIDs completely and take proton pump inhibitor twice daily (was only on baby ASA a day).  Repeat EGD, October 2010  Gastritis, duodenitis was much improved.  2. Routine risk for colon cancer, status post colonoscopy 2007 that showed no polyps. Recommended recall colonoscopy 2017 for screening. 3. s/p fundoplication 4132G at [facility] for severe GERD symptoms (Dr. Dossie Chavez), 4. symptomatic cholelithiasis, underwent endoscopic cholecystectomy 2010 november; for chronic cholecystitis and gallstones   HPI: This is a very pleasant 77 year old man who was referred to me by Caleb Carbon, MD  to evaluate fecal occult blood positive stools.    Chief complaint is occult blood in stool  He never sees blood in his stool overtly.  He does not have any trouble with constipation or diarrhea.  He generally has a bowel movement after most meals.  He is not bothered by that, it has been his pattern for many many years.  He has no significant abdominal pains and his weight is overall stable    Old Data Reviewed: + FOB stool test 10/2017 [facility] 09/2017 was normal.  He saw his primary care physician 3 or 4 months ago in the office.  His primary care physician ordered an echocardiogram given his previous documented CHF.  He has not had that echocardiogram done yet.    Review of systems: Pertinent positive and negative review of systems were noted in the above HPI section. All other review negative.   Past Medical History:  Diagnosis Date  . CAD (coronary artery disease)    Non obstructive (30-40% Circ, 40% RCA AM,2008)  . Depression   . Dyshidrotic eczema   . ED (erectile dysfunction)   . Gastritis and duodenitis   . GERD (gastroesophageal reflux  disease)   . Heart failure    EF 40%, Echo 01-2009  . OA (osteoarthritis)   . Renal disorder    kidney stones  . RLS (restless legs syndrome)   . Sleep disturbance    due to RLS    Past Surgical History:  Procedure Laterality Date  . CARPAL TUNNEL RELEASE  05/2003   Right ([place])  . CATARACT EXTRACTION  10/2008   Left  . CATARACT EXTRACTION  11/2008   Right  . ESOPHAGOGASTRIC FUNDOPLICATION  4010   scope-[facility]  . KIDNEY STONE SURGERY  1997/2000  . LAPAROSCOPIC CHOLECYSTECTOMY  04/2009   Dr. Johney Chavez  . URETEROSCOPY  05/2008   Caleb Chavez    Current Outpatient Medications  Medication Sig Dispense Refill  . acetaminophen (TYLENOL 8 HOUR) 650 MG CR tablet Take 650 mg by mouth every 8 (eight) hours as needed for pain.     No current facility-administered medications for this visit.     Allergies as of 01/09/2018 - Review Complete 01/09/2018  Allergen Reaction Noted  . Penicillins  09/15/2006    Family History  Problem Relation Age of Onset  . Heart attack Father        4 total and bypass  . Heart disease Father   . Cancer Mother        unknown type  . Arthritis Sister     Social History   Socioeconomic History  . Marital status: Married    Spouse name: Not on file  . Number of children: 3  . Years of education: Not on  file  . Highest education level: Not on file  Occupational History  . Occupation: Retired    Comment: Airline pilot. Counsellor  . Occupation: PARK MANAGER    Employer: [employer]    Comment: [place] [employer]--part time  Social Needs  . Financial resource strain: Not on file  . Food insecurity:    Worry: Not on file    Inability: Not on file  . Transportation needs:    Medical: Not on file    Non-medical: Not on file  Tobacco Use  . Smoking status: Never Smoker  . Smokeless tobacco: Never Used  Substance and Sexual Activity  . Alcohol use: Yes    Comment: occasional  . Drug use: Never  . Sexual activity:  Not on file  Lifestyle  . Physical activity:    Days per week: Not on file    Minutes per session: Not on file  . Stress: Not on file  Relationships  . Social connections:    Talks on phone: Not on file    Gets together: Not on file    Attends religious service: Not on file    Active member of club or organization: Not on file    Attends meetings of clubs or organizations: Not on file    Relationship status: Not on file  . Intimate partner violence:    Fear of current or ex partner: Not on file    Emotionally abused: Not on file    Physically abused: Not on file    Forced sexual activity: Not on file  Other Topics Concern  . Not on file  Social History Narrative   Divorced then remarried 5/12   Has living will   Requests wife then  nephew Caleb Chavez as his health care POA.    Would accept resuscitation attempts   Probably would accept a feeding tube--at least for some time     Physical Exam: BP 132/76   Pulse 74   Ht 6\' 2"  (1.88 m)   Wt 223 lb 8 oz (101.4 kg)   BMI 28.70 kg/m  Constitutional: generally well-appearing Psychiatric: alert and oriented x3 Eyes: extraocular movements intact Mouth: oral pharynx moist, no lesions Neck: supple no lymphadenopathy Cardiovascular: heart regular rate and rhythm Lungs: clear to auscultation bilaterally Abdomen: soft, nontender, nondistended, no obvious ascites, no peritoneal signs, normal bowel sounds Extremities: no lower extremity edema bilaterally Skin: no lesions on visible extremities   Assessment and plan: 77 y.o. male with fecal occult blood test positive stool  His primary care physician ordered an echocardiogram for him 2 or 3 months ago but he had to cancel because he fell and he has not rescheduled.  He has a history of a low ejection fraction and although he has no signs or symptoms currently of CHF I think it is best that he have that echocardiogram done before we decide on the location of his colonoscopy.  It is  safest to be done at the hospital if his ejection fraction is less than 35 or 40%.  We will try to help him get the echocardiogram rescheduled and when I see the results we will decide on the place of colonoscopy.    Please see the "Patient Instructions" section for addition details about the plan.   Caleb Loffler, MD [facility] 01/09/2018, 10:08 AM  Cc: Caleb Carbon, MD

## 2018-01-09 NOTE — Telephone Encounter (Signed)
I spoke to [facility] at Dr Caroline Sauger office to inform her that Dr Ardis Hughs saw patient in our office today and would like to know when patient will be rescheduled for an echocardiogram. I explained to her that Dr Ardis Hughs will need to see the results of the echo in order to decide if patient will proceed with a colonoscopy. Demmie stated she will send a message to Dr Lubertha Sayres to get echo rescheduled, notify patient, and send results to our office.

## 2018-01-09 NOTE — Patient Instructions (Addendum)
Please continue with plans for an echocardiogram (ordered by Dr. Silvio Pate recently). After those results are reviewed, will decide on colonoscopy.  Normal BMI (Body Mass Index- based on height and weight) is between 23 and 30. Your BMI today is Body mass index is 28.7 kg/m. Marland Kitchen Please consider follow up  regarding your BMI with your Primary Care Provider.

## 2018-01-09 NOTE — Telephone Encounter (Signed)
Copied from [place] [id number]. Topic: Quick Communication - See Telephone Encounter >> Jan 09, 2018  3:54 PM Rutherford Nail, NT wrote: CRM for notification. See Telephone encounter for: 01/09/18. Claiborne Billings with [facility] Dr Ardis Hughs office calling and states that and ECG is needing to be scheduled. States that she sees that Dr Silvio Pate was going to do one, but the results didn't get put in or it was not scheduled. States that the patient will need an ECG before they can do the colonoscopy. Please advise.  CB#: [phone]

## 2018-01-10 NOTE — Telephone Encounter (Signed)
I was not aware of any need for an EKG Okay to set up a nurse visit for the EKG and I can look at it

## 2018-01-12 ENCOUNTER — Telehealth: Payer: Self-pay | Admitting: Internal Medicine

## 2018-01-12 ENCOUNTER — Telehealth: Payer: Self-pay | Admitting: Gastroenterology

## 2018-01-12 DIAGNOSIS — I255 Ischemic cardiomyopathy: Secondary | ICD-10-CM

## 2018-01-12 DIAGNOSIS — I420 Dilated cardiomyopathy: Principal | ICD-10-CM

## 2018-01-12 NOTE — Telephone Encounter (Signed)
New 2D Echo order is needed, sent phone note to Dr Silvio Pate and Larene Beach to put New order in. Patient has been scheduled and [facility] has been notified.

## 2018-01-12 NOTE — Telephone Encounter (Signed)
Called [facility] to get patient rescheduled for the 2D Echocardiogram that patient cancelled in April and they need a New 2D Echocardiogram order put into Epic they cant use the old order.Please place New order into Epic.

## 2018-01-12 NOTE — Telephone Encounter (Signed)
There is one ordered from April See if it can be scheduled

## 2018-01-12 NOTE — Telephone Encounter (Signed)
I spoke to patient and scheduled a nurse visit on 01/14/18. I called Kelly at GI to let her know we had scheduled the EKG.  Claiborne Billings said they need an echocardiogram ordered,not an EKG.  I left a message for patient to call me back to cancel the appointment on 01/14/18.

## 2018-01-12 NOTE — Telephone Encounter (Signed)
Patient was scheduled for the Echo in April but cancelled the appointment. Called the patient and had to leave a message on his machine to call me back to get re-scheduled for the Echocardiogram.

## 2018-01-12 NOTE — Telephone Encounter (Signed)
Noted. Will forward results to Dr Ardis Hughs

## 2018-01-12 NOTE — Telephone Encounter (Signed)
New order place and old order D/C

## 2018-01-14 ENCOUNTER — Ambulatory Visit: Payer: Medicare Other

## 2018-01-15 ENCOUNTER — Ambulatory Visit (HOSPITAL_COMMUNITY): Payer: Medicare Other | Attending: Cardiovascular Disease

## 2018-01-15 ENCOUNTER — Other Ambulatory Visit: Payer: Self-pay

## 2018-01-15 DIAGNOSIS — I420 Dilated cardiomyopathy: Secondary | ICD-10-CM | POA: Insufficient documentation

## 2018-01-15 DIAGNOSIS — I77810 Thoracic aortic ectasia: Secondary | ICD-10-CM | POA: Insufficient documentation

## 2018-01-15 DIAGNOSIS — I517 Cardiomegaly: Secondary | ICD-10-CM | POA: Diagnosis not present

## 2018-01-15 DIAGNOSIS — I255 Ischemic cardiomyopathy: Secondary | ICD-10-CM | POA: Diagnosis not present

## 2018-01-15 DIAGNOSIS — I351 Nonrheumatic aortic (valve) insufficiency: Secondary | ICD-10-CM | POA: Diagnosis not present

## 2018-01-15 MED ORDER — PERFLUTREN LIPID MICROSPHERE
1.0000 mL | INTRAVENOUS | Status: AC | PRN
  Administered 2018-01-15: 1 mL via INTRAVENOUS

## 2018-01-17 NOTE — Telephone Encounter (Signed)
Echo shows LV EF is 25-30% now.   This is pretty low.  Not sure if he is going to need further cardiac workup.  Will hold on scheduling FOBT + colonoscopy until we know more.  PLease let him know.   Dr. Silvio Pate, His EF is pretty low but he looks pretty good overall.  Wondering if you are planning to have him get more cardiac testing.  If so, will wait till that outcome before going ahead with colonoscopy.  If not, we'll get him scheduled for colonoscopy.  Thanks for your input.  Radonna Ricker

## 2018-01-17 NOTE — Telephone Encounter (Signed)
I was actually planning to recheck his echo in April because all cardiac symptoms were gone and I thought his EF might be more normal. I would not pursue further testing, etc at this point given his excellent functional status. I think it is reasonable to go ahead with any indicated GI work up (unless he reports new symptoms since April)

## 2018-01-17 NOTE — Telephone Encounter (Signed)
-----   Message from Angie Fava, LPN sent at 11/08/5181  9:43 AM EDT ----- Please review echo results form 01/15/18

## 2018-01-19 NOTE — Telephone Encounter (Signed)
Left message for patient to call back  

## 2018-01-19 NOTE — Telephone Encounter (Signed)
Patient returning Kelly's call. °

## 2018-01-20 ENCOUNTER — Other Ambulatory Visit: Payer: Self-pay | Admitting: Gastroenterology

## 2018-01-20 DIAGNOSIS — K921 Melena: Secondary | ICD-10-CM

## 2018-01-20 NOTE — Progress Notes (Signed)
Spoke to patient, he is scheduled for a colonoscopy at [facility] 02/12/18 at 7:30am orders placed, scheduling notified. He will come in for a nurse visit on 01/28/18 at 1:30 to go over instructions. Patient voiced understanding.

## 2018-01-20 NOTE — Telephone Encounter (Signed)
Spoke to patient Colonoscopy at [facility] ordered and scheduled. See note

## 2018-01-20 NOTE — Telephone Encounter (Signed)
Great, thanks  [place] , [place] to go ahead with scheduling colonoscopy at [facility], my next available MAC time (check with patty to make sure about 1/2 days timing, thanks.)  DJ

## 2018-01-20 NOTE — Telephone Encounter (Signed)
noted 

## 2018-01-28 ENCOUNTER — Ambulatory Visit (AMBULATORY_SURGERY_CENTER): Payer: Self-pay | Admitting: *Deleted

## 2018-01-28 VITALS — Ht 74.0 in | Wt 223.0 lb

## 2018-01-28 DIAGNOSIS — K921 Melena: Secondary | ICD-10-CM

## 2018-01-28 MED ORDER — PEG 3350-KCL-NA BICARB-NACL 420 G PO SOLR: 4000.0000 mL | Freq: Once | ORAL | 0 refills | Status: AC

## 2018-01-28 NOTE — Progress Notes (Signed)
No egg or soy allergy known to patient  No issues with past sedation with any surgeries  or procedures, no intubation problems  No diet pills per patient No home 02 use per patient  No blood thinners per patient  Pt denies issues with constipation  No A fib or A flutter  EMMI video sent to pt's e mail pt declined   

## 2018-02-12 ENCOUNTER — Encounter (HOSPITAL_COMMUNITY)
Admission: RE | Disposition: A | Discharge status: Home / Self Care | Payer: Self-pay | Source: Ambulatory Visit | Attending: Gastroenterology

## 2018-02-12 ENCOUNTER — Ambulatory Visit (HOSPITAL_COMMUNITY): Payer: Medicare Other | Admitting: Anesthesiology

## 2018-02-12 ENCOUNTER — Ambulatory Visit (HOSPITAL_COMMUNITY)
Admission: RE | Admit: 2018-02-12 | Discharge: 2018-02-12 | Disposition: A | Discharge status: Home / Self Care | Payer: Medicare Other | Source: Ambulatory Visit | Attending: Gastroenterology | Admitting: Gastroenterology

## 2018-02-12 ENCOUNTER — Encounter (HOSPITAL_COMMUNITY): Payer: Self-pay | Admitting: *Deleted

## 2018-02-12 ENCOUNTER — Other Ambulatory Visit: Payer: Self-pay

## 2018-02-12 DIAGNOSIS — D125 Benign neoplasm of sigmoid colon: Secondary | ICD-10-CM

## 2018-02-12 DIAGNOSIS — D123 Benign neoplasm of transverse colon: Secondary | ICD-10-CM | POA: Diagnosis not present

## 2018-02-12 DIAGNOSIS — K921 Melena: Secondary | ICD-10-CM | POA: Diagnosis not present

## 2018-02-12 DIAGNOSIS — K635 Polyp of colon: Secondary | ICD-10-CM

## 2018-02-12 DIAGNOSIS — Z880 Allergy status to penicillin: Secondary | ICD-10-CM | POA: Diagnosis not present

## 2018-02-12 DIAGNOSIS — R195 Other fecal abnormalities: Secondary | ICD-10-CM | POA: Diagnosis not present

## 2018-02-12 DIAGNOSIS — K219 Gastro-esophageal reflux disease without esophagitis: Secondary | ICD-10-CM | POA: Diagnosis not present

## 2018-02-12 DIAGNOSIS — I2510 Atherosclerotic heart disease of native coronary artery without angina pectoris: Secondary | ICD-10-CM | POA: Diagnosis not present

## 2018-02-12 DIAGNOSIS — I509 Heart failure, unspecified: Secondary | ICD-10-CM | POA: Diagnosis not present

## 2018-02-12 DIAGNOSIS — M1990 Unspecified osteoarthritis, unspecified site: Secondary | ICD-10-CM | POA: Diagnosis not present

## 2018-02-12 DIAGNOSIS — I110 Hypertensive heart disease with heart failure: Secondary | ICD-10-CM | POA: Diagnosis not present

## 2018-02-12 DIAGNOSIS — G2581 Restless legs syndrome: Secondary | ICD-10-CM | POA: Insufficient documentation

## 2018-02-12 DIAGNOSIS — F329 Major depressive disorder, single episode, unspecified: Secondary | ICD-10-CM | POA: Insufficient documentation

## 2018-02-12 HISTORY — PX: POLYPECTOMY: SHX5525

## 2018-02-12 HISTORY — PX: COLONOSCOPY WITH PROPOFOL: SHX5780

## 2018-02-12 SURGERY — COLONOSCOPY WITH PROPOFOL
Anesthesia: Monitor Anesthesia Care

## 2018-02-12 MED ORDER — LIDOCAINE 2% (20 MG/ML) 5 ML SYRINGE
INTRAMUSCULAR | Status: DC | PRN
  Administered 2018-02-12: 100 mg via INTRAVENOUS

## 2018-02-12 MED ORDER — PROPOFOL 500 MG/50ML IV EMUL
INTRAVENOUS | Status: DC | PRN
  Administered 2018-02-12: 125 ug/kg/min via INTRAVENOUS

## 2018-02-12 MED ORDER — PROPOFOL 10 MG/ML IV BOLUS
INTRAVENOUS | Status: DC | PRN
  Administered 2018-02-12: 20 mg via INTRAVENOUS

## 2018-02-12 MED ORDER — ONDANSETRON HCL 4 MG/2ML IJ SOLN
INTRAMUSCULAR | Status: DC | PRN
  Administered 2018-02-12: 4 mg via INTRAVENOUS

## 2018-02-12 MED ORDER — PROPOFOL 10 MG/ML IV BOLUS
INTRAVENOUS | Status: AC
  Filled 2018-02-12: qty 40

## 2018-02-12 MED ORDER — LACTATED RINGERS IV SOLN
INTRAVENOUS | Status: DC
  Administered 2018-02-12: 1000 mL via INTRAVENOUS

## 2018-02-12 MED ORDER — SODIUM CHLORIDE 0.9 % IV SOLN: INTRAVENOUS | Status: DC

## 2018-02-12 SURGICAL SUPPLY — 22 items

## 2018-02-12 NOTE — Discharge Instructions (Signed)
YOU HAD AN ENDOSCOPIC PROCEDURE TODAY: Refer to the procedure report and other information in the discharge instructions given to you for any specific questions about what was found during the examination. If this information does not answer your questions, please call [place] office at [phone] to clarify.  ° °YOU SHOULD EXPECT: Some feelings of bloating in the abdomen. Passage of more gas than usual. Walking can help get rid of the air that was put into your GI tract during the procedure and reduce the bloating. If you had a lower endoscopy (such as a colonoscopy or flexible sigmoidoscopy) you may notice spotting of blood in your stool or on the toilet paper. Some abdominal soreness may be present for a day or two, also. ° °DIET: Your first meal following the procedure should be a light meal and then it is ok to progress to your normal diet. A half-sandwich or bowl of soup is an example of a good first meal. Heavy or fried foods are harder to digest and may make you feel nauseous or bloated. Drink plenty of fluids but you should avoid alcoholic beverages for 24 hours. If you had a esophageal dilation, please see attached instructions for diet.   ° °ACTIVITY: Your care partner should take you home directly after the procedure. You should plan to take it easy, moving slowly for the rest of the day. You can resume normal activity the day after the procedure however YOU SHOULD NOT DRIVE, use power tools, machinery or perform tasks that involve climbing or major physical exertion for 24 hours (because of the sedation medicines used during the test).  ° °SYMPTOMS TO REPORT IMMEDIATELY: °A gastroenterologist can be reached at any hour. Please call [phone]  for any of the following symptoms:  °Following lower endoscopy (colonoscopy, flexible sigmoidoscopy) °Excessive amounts of blood in the stool  °Significant tenderness, worsening of abdominal pains  °Swelling of the abdomen that is new, acute  °Fever of 100° or  higher  °Following upper endoscopy (EGD, EUS, ERCP, esophageal dilation) °Vomiting of blood or coffee ground material  °New, significant abdominal pain  °New, significant chest pain or pain under the shoulder blades  °Painful or persistently difficult swallowing  °New shortness of breath  °Black, tarry-looking or red, bloody stools ° °FOLLOW UP:  °If any biopsies were taken you will be contacted by phone or by letter within the next 1-3 weeks. Call [phone]  if you have not heard about the biopsies in 3 weeks.  °Please also call with any specific questions about appointments or follow up tests. ° °

## 2018-02-12 NOTE — Anesthesia Preprocedure Evaluation (Signed)
Anesthesia Evaluation  Patient identified by MRN, date of birth, ID band Patient awake    Reviewed: Allergy & Precautions, NPO status , Patient's Chart, lab work & pertinent test results  History of Anesthesia Complications (+) PONV and history of anesthetic complications  Airway Mallampati: II  TM Distance: >3 FB Neck ROM: Full    Dental no notable dental hx.    Pulmonary neg pulmonary ROS,    Pulmonary exam normal breath sounds clear to auscultation       Cardiovascular hypertension, + CAD and +CHF  Normal cardiovascular exam Rhythm:Regular Rate:Normal  Echo 01/2018 - Left ventricle: The cavity size was mildly dilated. There was moderate concentric hypertrophy. Systolic function was severely  reduced. The estimated ejection fraction was in the range of 25% to 30%. Severe, diffuse hypokinesis with akinesis of the anteroseptal and inferoseptal myocardium. Doppler parameters are consistent with abnormal left ventricular relaxation (grade 1 diastolic dysfunction). Doppler parameters are consistent with high ventricular filling pressure. - Aortic valve: Transvalvular velocity was within the normal range.   There was no stenosis. There was mild regurgitation. - Ascending aorta: The ascending aorta was mildly dilated. 4.16 cm. - Mitral valve: Transvalvular velocity was within the normal range.  There was no evidence for stenosis. There was trivial  regurgitation. - Left atrium: The atrium was moderately dilated. - Right ventricle: The cavity size was normal. Wall thickness was normal. Systolic function was normal. - Tricuspid valve: There was trivial regurgitation. - Pulmonary arteries: Systolic pressure was within the normal range. PA peak pressure: 18 mm Hg (S).   Neuro/Psych PSYCHIATRIC DISORDERS Depression negative neurological ROS     GI/Hepatic Neg liver ROS, GERD  ,  Endo/Other  negative endocrine ROS  Renal/GU Renal disease      Musculoskeletal  (+) Arthritis ,   Abdominal   Peds negative pediatric ROS (+)  Hematology negative hematology ROS (+)   Anesthesia Other Findings   Reproductive/Obstetrics negative OB ROS                             Anesthesia Physical Anesthesia Plan  ASA: IV  Anesthesia Plan: MAC   Post-op Pain Management:    Induction:   PONV Risk Score and Plan: Treatment may vary due to age or medical condition, Propofol infusion and Ondansetron  Airway Management Planned:   Additional Equipment:   Intra-op Plan:   Post-operative Plan:   Informed Consent: I have reviewed the patients History and Physical, chart, labs and discussed the procedure including the risks, benefits and alternatives for the proposed anesthesia with the patient or authorized representative who has indicated his/her understanding and acceptance.   Dental advisory given  Plan Discussed with: CRNA  Anesthesia Plan Comments:         Anesthesia Quick Evaluation

## 2018-02-12 NOTE — Transfer of Care (Signed)
Immediate Anesthesia Transfer of Care Note  Patient: Caleb Chavez  Procedure(s) Performed: COLONOSCOPY WITH PROPOFOL (N/A ) POLYPECTOMY  Patient Location: PACU  Anesthesia Type:MAC  Level of Consciousness: awake, alert  and oriented  Airway & Oxygen Therapy: Patient Spontanous Breathing and Patient connected to face mask oxygen  Post-op Assessment: Report given to RN and Post -op Vital signs reviewed and stable  Post vital signs: Reviewed and stable  Last Vitals:  Vitals Value Taken Time  BP    Temp    Pulse    Resp    SpO2      Last Pain:  Vitals:   02/12/18 0634  TempSrc: Oral  PainSc: 0-No pain         Complications: No apparent anesthesia complications

## 2018-02-12 NOTE — H&P (Signed)
Review of pertinent gastrointestinal problems: 1. Severe gastritis, duodenitis: presented with epigastric discomfort, nausea to the hospital August 2010. EGD revealed Severe gastritis, duodenitis, biopsies were H. pylori negative. He was recommended to avoid NSAIDs completely and take proton pump inhibitor twice daily (was only on baby ASA a day). Repeat EGD, October 2010 Gastritis, duodenitis was much improved.  2. Routine risk for colon cancer, status post colonoscopy 2007 that showed no polyps. Recommended recall colonoscopy 2017 for screening. 3. s/p fundoplication 1324M at [facility] for severe GERD symptoms (Dr. Dossie Der), 4. symptomatic cholelithiasis, underwent endoscopic cholecystectomy 2010 november; for chronic cholecystitis and gallstones   HPI: This is a man found to have heme positive stool recently by PCP, normal Hb  Chief complaint is heme pos stool  EF 20-25% on recent echo  ROS: complete GI ROS as described in HPI, all other review negative.  Constitutional:  No unintentional weight loss   Past Medical History:  Diagnosis Date  . CAD (coronary artery disease)    Non obstructive (30-40% Circ, 40% RCA AM,2008)  . CHF (congestive heart failure) ([place])   . Depression   . Dyshidrotic eczema   . ED (erectile dysfunction)   . Gastritis and duodenitis   . GERD (gastroesophageal reflux disease)    improved   . Heart failure    EF 40%, Echo 01-2009  . OA (osteoarthritis)   . PONV (postoperative nausea and vomiting)    past hx of PONV if under anesthesia a long period   . Renal disorder    kidney stones  . RLS (restless legs syndrome)   . Sleep disturbance    due to RLS    Past Surgical History:  Procedure Laterality Date  . CARPAL TUNNEL RELEASE Bilateral 05/2003   Right ([place])  . CATARACT EXTRACTION  10/2008   Left  . CATARACT EXTRACTION  11/2008   Right  . COLONOSCOPY    . ESOPHAGOGASTRIC FUNDOPLICATION  0102   scope-[facility]  . KIDNEY STONE SURGERY  1997/2000   . LAPAROSCOPIC CHOLECYSTECTOMY  04/2009   Dr. Johney Maine  . UPPER GASTROINTESTINAL ENDOSCOPY    . URETEROSCOPY  05/2008   [place]    Current Facility-Administered Medications  Medication Dose Route Frequency Provider Last Rate Last Dose  . 0.9 %  sodium chloride infusion   Intravenous Continuous Milus Banister, MD      . lactated ringers infusion   Intravenous Continuous Milus Banister, MD 10 mL/hr at 02/12/18 0638 1,000 mL at 02/12/18 7253    Allergies as of 01/20/2018 - Review Complete 01/09/2018  Allergen Reaction Noted  . Penicillins  09/15/2006    Family History  Problem Relation Age of Onset  . Heart attack Father        4 total and bypass  . Heart disease Father   . Cancer Mother        unknown type  . Arthritis Sister   . Colon cancer Neg Hx   . Colon polyps Neg Hx   . Esophageal cancer Neg Hx   . Rectal cancer Neg Hx   . Stomach cancer Neg Hx     Social History   Socioeconomic History  . Marital status: Married    Spouse name: Not on file  . Number of children: 3  . Years of education: Not on file  . Highest education level: Not on file  Occupational History  . Occupation: Retired    Comment: Airline pilot. Counsellor  . Occupation: [place]  Employer: [employer]    Comment: [place]--part time  Social Needs  . Financial resource strain: Not on file  . Food insecurity:    Worry: Not on file    Inability: Not on file  . Transportation needs:    Medical: Not on file    Non-medical: Not on file  Tobacco Use  . Smoking status: Never Smoker  . Smokeless tobacco: Never Used  Substance and Sexual Activity  . Alcohol use: Yes    Comment: occasional  . Drug use: Never  . Sexual activity: Not on file  Lifestyle  . Physical activity:    Days per week: Not on file    Minutes per session: Not on file  . Stress: Not on file  Relationships  . Social connections:    Talks on phone: Not on file    Gets  together: Not on file    Attends religious service: Not on file    Active member of club or organization: Not on file    Attends meetings of clubs or organizations: Not on file    Relationship status: Not on file  . Intimate partner violence:    Fear of current or ex partner: Not on file    Emotionally abused: Not on file    Physically abused: Not on file    Forced sexual activity: Not on file  Other Topics Concern  . Not on file  Social History Narrative   Divorced then remarried 5/12   Has living will   Requests wife then  nephew [name] as his health care POA.    Would accept resuscitation attempts   Probably would accept a feeding tube--at least for some time     Physical Exam: BP (!) 172/92   Pulse 65   Temp 97.9 F (36.6 C) (Oral)   Resp 15   Ht 6\' 2"  (1.88 m)   Wt 101.6 kg   SpO2 99%   BMI 28.76 kg/m  Constitutional: generally well-appearing Psychiatric: alert and oriented x3 Abdomen: soft, nontender, nondistended, no obvious ascites, no peritoneal signs, normal bowel sounds No peripheral edema noted in lower extremities  Assessment and plan: 77 y.o. male with heme positive stool  For colonoscopy today.  Please see the "Patient Instructions" section for addition details about the plan.  Owens Loffler, MD [facility] 02/12/2018, 7:16 AM

## 2018-02-12 NOTE — Op Note (Signed)
[facility] Patient Name: Caleb Chavez Procedure Date: 02/12/2018 MRN: [id number] Attending MD: Milus Banister , MD Date of Birth: 01/27/1941 CSN: [id number] Age: 77 Admit Type: Outpatient Procedure:                Colonoscopy Indications:              Heme positive stool, not anemic Providers:                Milus Banister, MD, Elmer Ramp. Tilden Dome, RN,                            William Dalton, Technician Referring MD:              Medicines:                Monitored Anesthesia Care Complications:            No immediate complications. Estimated blood loss:                            None. Estimated Blood Loss:     Estimated blood loss: none. Procedure:                Pre-Anesthesia Assessment:                           - Prior to the procedure, a History and Physical                            was performed, and patient medications and                            allergies were reviewed. The patient's tolerance of                            previous anesthesia was also reviewed. The risks                            and benefits of the procedure and the sedation                            options and risks were discussed with the patient.                            All questions were answered, and informed consent                            was obtained. Prior Anticoagulants: The patient has                            taken no previous anticoagulant or antiplatelet                            agents. ASA Grade Assessment: IV - A patient with  severe systemic disease that is a constant threat                            to life. After reviewing the risks and benefits,                            the patient was deemed in satisfactory condition to                            undergo the procedure.                           After obtaining informed consent, the colonoscope                            was passed under direct vision. Throughout the                          procedure, the patient's blood pressure, pulse, and                            oxygen saturations were monitored continuously. The                            CF-HQ190L ([id number] Olympus adult colonoscope was                            introduced through the anus and advanced to the the                            cecum, identified by appendiceal orifice and                            ileocecal valve. The colonoscopy was performed                            without difficulty. The patient tolerated the                            procedure well. The quality of the bowel                            preparation was good. The ileocecal valve,                            appendiceal orifice, and rectum were photographed. Scope In: 7:33:02 AM Scope Out: 7:59:14 AM Scope Withdrawal Time: 0 hours 20 minutes 40 seconds  Total Procedure Duration: 0 hours 26 minutes 12 seconds  Findings:      A 2 mm polyp was found in the transverse colon. The polyp was sessile.       The polyp was removed with a cold snare. Resection and retrieval were       complete.      A 10 mm polyp was found in the sigmoid colon. The polyp was  semi-pedunculated. The polyp was removed with a hot snare. Resection and       retrieval were complete.      The exam was otherwise without abnormality on direct and retroflexion       views. Impression:               - One 2 mm polyp in the transverse colon, removed                            with a cold snare. Resected and retrieved.                           - One 10 mm polyp in the sigmoid colon, removed                            with a hot snare. Resected and retrieved.                           - The examination was otherwise normal on direct                            and retroflexion views. Moderate Sedation:      N/A- Per Anesthesia Care Recommendation:           - Patient has a contact number available for                            emergencies.  The signs and symptoms of potential                            delayed complications were discussed with the                            patient. Return to normal activities tomorrow.                            Written discharge instructions were provided to the                            patient.                           - Resume previous diet.                           - Continue present medications.                           Await final pathology results. You will not likely                            need any further colon cancer screening tests                            (including stool testing). These types of tests  generally stop around age 12-80. Procedure Code(s):        --- Professional ---                           [id number], Colonoscopy, flexible; with removal of                            tumor(s), polyp(s), or other lesion(s) by snare                            technique Diagnosis Code(s):        --- Professional ---                           D12.3, Benign neoplasm of transverse colon (hepatic                            flexure or splenic flexure)                           D12.5, Benign neoplasm of sigmoid colon                           R19.5, Other fecal abnormalities CPT copyright 2017 American Medical Association. All rights reserved. The codes documented in this report are preliminary and upon coder review may  be revised to meet current compliance requirements. Milus Banister, MD 02/12/2018 8:03:21 AM This report has been signed electronically. Number of Addenda: 0

## 2018-02-12 NOTE — Anesthesia Postprocedure Evaluation (Signed)
Anesthesia Post Note  Patient: Caleb Chavez  Procedure(s) Performed: COLONOSCOPY WITH PROPOFOL (N/A ) POLYPECTOMY     Patient location during evaluation: PACU Anesthesia Type: MAC Level of consciousness: awake and alert Pain management: pain level controlled Vital Signs Assessment: post-procedure vital signs reviewed and stable Respiratory status: spontaneous breathing Cardiovascular status: stable Anesthetic complications: no    Last Vitals:  Vitals:   02/12/18 0810 02/12/18 0820  BP: 128/72 133/72  Pulse: (!) 59   Resp: 15 12  Temp:    SpO2: 96% 98%    Last Pain:  Vitals:   02/12/18 0820  TempSrc:   PainSc: 0-No pain                 Nolon Nations

## 2018-02-13 ENCOUNTER — Encounter (HOSPITAL_COMMUNITY): Payer: Self-pay | Admitting: Gastroenterology

## 2018-03-04 ENCOUNTER — Ambulatory Visit: Payer: Medicare Other | Admitting: Internal Medicine

## 2018-03-05 ENCOUNTER — Ambulatory Visit (INDEPENDENT_AMBULATORY_CARE_PROVIDER_SITE_OTHER): Payer: Medicare Other

## 2018-03-05 DIAGNOSIS — Z23 Encounter for immunization: Secondary | ICD-10-CM | POA: Diagnosis not present

## 2018-05-30 ENCOUNTER — Emergency Department (HOSPITAL_COMMUNITY)
Admission: EM | Admit: 2018-05-30 | Discharge: 2018-05-30 | Disposition: A | Discharge status: Home / Self Care | Payer: Medicare Other | Attending: Emergency Medicine | Admitting: Emergency Medicine

## 2018-05-30 ENCOUNTER — Other Ambulatory Visit: Payer: Self-pay

## 2018-05-30 ENCOUNTER — Emergency Department (HOSPITAL_COMMUNITY): Payer: Medicare Other

## 2018-05-30 ENCOUNTER — Encounter (HOSPITAL_COMMUNITY): Payer: Self-pay | Admitting: Pharmacy Technician

## 2018-05-30 DIAGNOSIS — I509 Heart failure, unspecified: Secondary | ICD-10-CM | POA: Diagnosis not present

## 2018-05-30 DIAGNOSIS — Z79899 Other long term (current) drug therapy: Secondary | ICD-10-CM | POA: Insufficient documentation

## 2018-05-30 DIAGNOSIS — I110 Hypertensive heart disease with heart failure: Secondary | ICD-10-CM | POA: Insufficient documentation

## 2018-05-30 DIAGNOSIS — M5416 Radiculopathy, lumbar region: Secondary | ICD-10-CM | POA: Insufficient documentation

## 2018-05-30 DIAGNOSIS — M545 Low back pain: Secondary | ICD-10-CM | POA: Diagnosis present

## 2018-05-30 DIAGNOSIS — M546 Pain in thoracic spine: Secondary | ICD-10-CM | POA: Diagnosis not present

## 2018-05-30 MED ORDER — ACETAMINOPHEN 500 MG PO TABS
1000.0000 mg | ORAL_TABLET | Freq: Once | ORAL | Status: AC
  Administered 2018-05-30: 1000 mg via ORAL
  Filled 2018-05-30: qty 2

## 2018-05-30 NOTE — ED Triage Notes (Signed)
Pt reports central lumbar pain and R leg pain onset Wednesday. States was sitting on the couch when the pain started. CMS intact. Denies loss of bowel or bladder.

## 2018-05-30 NOTE — ED Notes (Signed)
Patient verbalizes understanding of discharge instructions. Opportunity for questioning and answers were provided. 

## 2018-05-30 NOTE — Discharge Instructions (Signed)
Your x-ray shows some chronic degenerative changes but no evidence of fracture.  Please use Tylenol 1000 mg every 6 hours, and a salon pas lidocaine patches for pain, you can also alternate ice and heat.  Follow-up with your primary care doctor if symptoms are not improving through the weekend.  Return to the emergency department if you have numbness or weakness in your legs, loss of control of your bowels or bladder, significantly worsened pain or any other new or concerning symptoms.

## 2018-05-30 NOTE — ED Provider Notes (Signed)
Care assumed from Dr. Cathleen Fears, please see his note for full details, but in brief Caleb Chavez is a 77 y.o. male who presents for evaluation of pain in the low back radiating into the buttock for the past 4 days.  Pain worse with position change.  Neurologically intact without concern for cauda equina.  No abdominal pain.  At shift change lumbar x-ray pending, if it does not show any evidence of acute fracture will plan for discharge with treatment with Tylenol, lidocaine patches and close follow-up with PCP.  Plan: Follow-up on x-rays and reevaluate patient's pain after Tylenol here in the emergency department.  Dg Lumbar Spine Complete  Result Date: 05/30/2018 CLINICAL DATA:  Pain without trauma. EXAM: LUMBAR SPINE - COMPLETE 4+ VIEW COMPARISON:  12/25/2017 FINDINGS: Five lumbar type vertebral bodies. Minimal convex left lumbar spine curvature. Cholecystectomy. Sacroiliac joints are symmetric. Aortic atherosclerosis. Moderate lumbosacral spondylosis, with degenerate disc disease at L4-5 and L5-S1. IMPRESSION: 1. Lumbosacral spondylosis, without acute osseous abnormality. 2. Aortic atherosclerosis. Electronically Signed   By: Abigail Miyamoto M.D.   On: 05/30/2018 19:56   MDM  X-ray with some chronic degenerative changes but no focal fracture or acute abnormality.  Patient symptoms likely due to sciatica.  Will continue to treat with Tylenol as patient reports good pain relief with this here in the emergency department I have also recommended over-the-counter lidocaine patches.  Patient to follow-up with his PCP if symptoms not improving over the next few days.  Return precautions discussed.  Patient expresses understanding and is in agreement with plan.  Stable for discharge home in good condition.\  Final diagnoses:  Lumbar radiculopathy   ED Discharge Orders    None        Janet Berlin 05/30/18 2120    Orlie Dakin, MD 05/31/18 [phone]

## 2018-05-30 NOTE — ED Provider Notes (Signed)
[facility] EMERGENCY DEPARTMENT Provider Note   CSN: [id number] Arrival date & time: 05/30/18  1729     History   Chief Complaint Chief Complaint  Patient presents with  . Back Pain  . Leg Pain    HPI Caleb Chavez is a 77 y.o. male.  Complains of low back pain radiating to right buttock onset 4 days ago after he got up from a seated position.  Pain is worse with changing positions or when he stands up from a seated position improved with remaining still.  No abdominal pain no loss of bladder or bowel control no other associated symptoms.  He treated himself with Aleve with slight relief.  A family member also treated him with medicine for "gas" as she felt that his abdomen looked bloated and thought he may have gas.  Patient reports no abdominal discomfort and no feeling of gas in his abdomen  HPI  Past Medical History:  Diagnosis Date  . CAD (coronary artery disease)    Non obstructive (30-40% Circ, 40% RCA AM,2008)  . CHF (congestive heart failure) ([place])   . Depression   . Dyshidrotic eczema   . ED (erectile dysfunction)   . Gastritis and duodenitis   . GERD (gastroesophageal reflux disease)    improved   . Heart failure    EF 40%, Echo 01-2009  . OA (osteoarthritis)   . PONV (postoperative nausea and vomiting)    past hx of PONV if under anesthesia a long period   . Renal disorder    kidney stones  . RLS (restless legs syndrome)   . Sleep disturbance    due to RLS    Patient Active Problem List   Diagnosis Date Noted  . Blood in stool   . Polyp of transverse colon   . Polyp of sigmoid colon   . Advance directive discussed with patient 09/23/2017  . Eustachian tube dysfunction 08/25/2017  . Routine general medical examination at a health care facility 07/14/2012  . Actinic keratosis 07/14/2012  . ED (erectile dysfunction)   . ESSENTIAL HYPERTENSION, BENIGN 06/27/2009  . HELICOBACTER PYLORI GASTRITIS, HX OF 03/07/2009  . 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 Osteoarthritis, 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/07/2007  . RESTLESS LEG SYNDROME 10/20/2006  . GERD 10/20/2006  . DYSHIDROTIC ECZEMA 10/20/2006  . Sleep disturbance 10/20/2006    Past Surgical History:  Procedure Laterality Date  . CARPAL TUNNEL RELEASE Bilateral 05/2003   Right ([place])  . CATARACT EXTRACTION  10/2008   Left  . CATARACT EXTRACTION  11/2008   Right  . COLONOSCOPY    . COLONOSCOPY WITH PROPOFOL N/A 02/12/2018   Procedure: COLONOSCOPY WITH PROPOFOL;  Surgeon: Milus Banister, MD;  Location: [facility];  Service: Endoscopy;  Laterality: N/A;  . ESOPHAGOGASTRIC FUNDOPLICATION  2409   scope-[facility]  . KIDNEY STONE SURGERY  1997/2000  . LAPAROSCOPIC CHOLECYSTECTOMY  04/2009   Dr. Johney Maine  . POLYPECTOMY  02/12/2018   Procedure: POLYPECTOMY;  Surgeon: Milus Banister, MD;  Location: [facility];  Service: Endoscopy;;  . UPPER GASTROINTESTINAL ENDOSCOPY    . URETEROSCOPY  05/2008   [facility]      Lumbar surgery many years ago  Home Medications    Prior to Admission medications   Medication Sig Start Date End Date Taking? Authorizing Provider  acetaminophen (TYLENOL 8 HOUR) 650 MG CR tablet Take 1,300 mg by mouth 2 (two) times daily.    Yes [provider]  naproxen sodium (ALEVE) 220  MG tablet Take 220-440 mg by mouth 2 (two) times daily as needed (for pain).    Yes [provider]  Simethicone (GAS RELIEF) 180 MG CAPS Take 180 mg by mouth as needed (for flatulence).   Yes [provider]    Family History Family History  Problem Relation Age of Onset  . Heart attack Father        4 total and bypass  . Heart disease Father   . Cancer Mother        unknown type  . Arthritis Sister   . Colon cancer Neg Hx   . Colon polyps Neg Hx   . Esophageal cancer Neg Hx   . Rectal cancer Neg Hx   . Stomach cancer Neg Hx     Social History Social History   Tobacco Use  . Smoking status: Never Smoker  . Smokeless tobacco:  Never Used  Substance Use Topics  . Alcohol use: Yes    Comment: occasional  . Drug use: Never     Allergies   Penicillins   Review of Systems Review of Systems  Constitutional: Negative.   HENT: Negative.   Respiratory: Negative.   Cardiovascular: Negative.   Gastrointestinal: Negative.   Musculoskeletal: Positive for back pain.  Skin: Negative.   Neurological: Negative.   Psychiatric/Behavioral: Negative.   All other systems reviewed and are negative.    Physical Exam Updated Vital Signs BP (!) 148/85 (BP Location: Right Arm)   Pulse 78   Temp 97.9 F (36.6 C) (Oral)   Resp 18   SpO2 96%   Physical Exam Vitals signs and nursing note reviewed.  Constitutional:      Appearance: Normal appearance. He is well-developed.  HENT:     Head: Normocephalic and atraumatic.  Eyes:     Conjunctiva/sclera: Conjunctivae normal.     Pupils: Pupils are equal, round, and reactive to light.  Neck:     Musculoskeletal: Neck supple.     Thyroid: No thyromegaly.     Trachea: No tracheal deviation.  Cardiovascular:     Rate and Rhythm: Normal rate and regular rhythm.     Heart sounds: No murmur.  Pulmonary:     Effort: Pulmonary effort is normal.     Breath sounds: Normal breath sounds.  Abdominal:     General: Bowel sounds are normal. There is no distension.     Palpations: Abdomen is soft.     Tenderness: There is no abdominal tenderness.  Musculoskeletal: Normal range of motion.        General: No tenderness.     Comments: There is a surgical scar midline overlying lumbar area.  Entire spine is nontender.  He has pain at lumbar area when he stands up from a seated position.  All 4 extremities are redness swelling or tenderness neurovascular intact.  Gait normal.  DTRs symmetric bilaterally at knee jerk ankle jerk and biceps toes downgoing bilaterally  Skin:    General: Skin is warm and dry.     Findings: No rash.  Neurological:     Mental Status: He is alert.      Coordination: Coordination normal.      ED Treatments / Results  Labs (all labs ordered are listed, but only abnormal results are displayed) Labs Reviewed - No data to display  EKG None  Radiology No results found.  Procedures Procedures (including critical care time)  Medications Ordered in ED Medications  acetaminophen (TYLENOL) tablet 1,000 mg (1,000 mg Oral  Given 05/30/18 1755)     Initial Impression / Assessment and Plan / ED Course  I have reviewed the triage vital signs and the nursing notes.  Pertinent labs & imaging results that were available during my care of the patient were reviewed by me and considered in my medical decision making (see chart for details).     I am going off shift at 7:15 PM.  Ms. Marijean Bravo will check radiologic studies and reassess patient.  Final Clinical Impressions(s) / ED Diagnoses  dxlumbar radiculopathy Final diagnoses:  None    ED Discharge Orders    None       Orlie Dakin, MD 05/30/18 [place]

## 2018-06-02 ENCOUNTER — Telehealth: Payer: Self-pay

## 2018-06-02 NOTE — Telephone Encounter (Signed)
Left message to call the office to f/u on pt after ER visit.

## 2018-09-21 NOTE — Telephone Encounter (Signed)
Will forward to Dr Einar Pheasant in Dr Alla German absence.

## 2018-09-22 MED ORDER — PREDNISONE 20 MG PO TABS: 40.0000 mg | ORAL_TABLET | Freq: Every day | ORAL | 0 refills | Status: DC

## 2018-09-22 NOTE — Addendum Note (Signed)
Addended by: Viviana Simpler I on: 09/22/2018 09:21 AM   Modules accepted: Orders

## 2018-09-29 ENCOUNTER — Encounter: Payer: Medicare Other | Admitting: Internal Medicine

## 2019-01-07 ENCOUNTER — Other Ambulatory Visit: Payer: Self-pay

## 2019-03-29 DIAGNOSIS — Z23 Encounter for immunization: Secondary | ICD-10-CM | POA: Diagnosis not present

## 2019-05-04 ENCOUNTER — Other Ambulatory Visit: Payer: Self-pay

## 2019-06-16 ENCOUNTER — Other Ambulatory Visit: Payer: Self-pay

## 2019-06-16 ENCOUNTER — Encounter: Payer: Self-pay | Admitting: Internal Medicine

## 2019-06-16 ENCOUNTER — Ambulatory Visit (INDEPENDENT_AMBULATORY_CARE_PROVIDER_SITE_OTHER): Payer: Medicare Other | Admitting: Internal Medicine

## 2019-06-16 VITALS — BP 126/84 | HR 48 | Temp 98.0°F | Ht 71.75 in | Wt 230.0 lb

## 2019-06-16 DIAGNOSIS — I420 Dilated cardiomyopathy: Secondary | ICD-10-CM | POA: Diagnosis not present

## 2019-06-16 DIAGNOSIS — Z0000 Encounter for general adult medical examination without abnormal findings: Secondary | ICD-10-CM | POA: Diagnosis not present

## 2019-06-16 DIAGNOSIS — G2581 Restless legs syndrome: Secondary | ICD-10-CM | POA: Diagnosis not present

## 2019-06-16 DIAGNOSIS — I10 Essential (primary) hypertension: Secondary | ICD-10-CM

## 2019-06-16 DIAGNOSIS — K219 Gastro-esophageal reflux disease without esophagitis: Secondary | ICD-10-CM | POA: Diagnosis not present

## 2019-06-16 DIAGNOSIS — I255 Ischemic cardiomyopathy: Secondary | ICD-10-CM | POA: Diagnosis not present

## 2019-06-16 DIAGNOSIS — Z7189 Other specified counseling: Secondary | ICD-10-CM

## 2019-06-16 LAB — CBC
HCT: 44.1 % (ref 39.0–52.0)
Hemoglobin: 14.4 g/dL (ref 13.0–17.0)
MCHC: 32.6 g/dL (ref 30.0–36.0)
MCV: 92.1 fl (ref 78.0–100.0)
Platelets: 248 10*3/uL (ref 150.0–400.0)
RBC: 4.78 Mil/uL (ref 4.22–5.81)
RDW: 14 % (ref 11.5–15.5)
WBC: 8.8 10*3/uL (ref 4.0–10.5)

## 2019-06-16 LAB — LIPID PANEL
Cholesterol: 124 mg/dL (ref 0–200)
HDL: 41.5 mg/dL (ref >39.00)
LDL Cholesterol: 67 mg/dL (ref 0–99)
NonHDL: 82.94
Total CHOL/HDL Ratio: 3
Triglycerides: 79 mg/dL (ref 0.0–149.0)
VLDL: 15.8 mg/dL (ref 0.0–40.0)

## 2019-06-16 LAB — COMPREHENSIVE METABOLIC PANEL
ALT: 19 U/L (ref 0–53)
AST: 20 U/L (ref 0–37)
Albumin: 4.5 g/dL (ref 3.5–5.2)
Alkaline Phosphatase: 70 U/L (ref 39–117)
BUN: 16 mg/dL (ref 6–23)
CO2: 29 mEq/L (ref 19–32)
Calcium: 9.6 mg/dL (ref 8.4–10.5)
Chloride: 104 mEq/L (ref 96–112)
Creatinine, Ser: 1.22 mg/dL (ref 0.40–1.50)
GFR: 57.07 mL/min — ABNORMAL LOW (ref >60.00)
Glucose, Bld: 134 mg/dL — ABNORMAL HIGH (ref 70–99)
Potassium: 4.8 mEq/L (ref 3.5–5.1)
Sodium: 140 mEq/L (ref 135–145)
Total Bilirubin: 1.4 mg/dL — ABNORMAL HIGH (ref 0.2–1.2)
Total Protein: 7.6 g/dL (ref 6.0–8.3)

## 2019-06-16 LAB — T4, FREE: Free T4: 0.91 ng/dL (ref 0.60–1.60)

## 2019-06-16 MED ORDER — CLONAZEPAM 0.5 MG PO TABS: 0.5000 mg | ORAL_TABLET | Freq: Every evening | ORAL | 0 refills | Status: DC | PRN

## 2019-06-16 NOTE — Assessment & Plan Note (Signed)
See social history 

## 2019-06-16 NOTE — Assessment & Plan Note (Signed)
No recent problems  °

## 2019-06-16 NOTE — Progress Notes (Signed)
Subjective:    Patient ID: Caleb Chavez, male    DOB: 11-Mar-1941, 79 y.o.   MRN: [id number]  HPI Here for Medicare wellness visit and follow up of chronic health conditions  This visit occurred during the SARS-CoV-2 public health emergency.  Safety protocols were in place, including screening questions prior to the visit, additional usage of staff PPE, and extensive cleaning of exam room while observing appropriate contact time as indicated for disinfecting solutions.   Reviewed form and advanced directives Reviewed other doctors Still works part time at [employer] then Did fall getting off lawnmower at work----did bruise hip (didn't need it checked) Vision okay--no recent eye check Hearing is poor--discussed considering hearing aides Rare drink of alcohol No tobacco No depression or anhedonia---doing okay with COVID Independent with instrumental ADLs Mild memory issues--names and dates  Doing fairly well His only concern is about a skin lesion on left temple  Current Outpatient Medications on File Prior to Visit  Medication Sig Dispense Refill  . acetaminophen (TYLENOL 8 HOUR) 650 MG CR tablet Take 1,300 mg by mouth 2 (two) times daily.      No current facility-administered medications on file prior to visit.    Allergies  Allergen Reactions  . Penicillins Hives    Has patient had a PCN reaction causing immediate rash, facial/tongue/throat swelling, SOB or lightheadedness with hypotension: Yes Has patient had a PCN reaction causing severe rash involving mucus membranes or skin necrosis: No Has patient had a PCN reaction that required hospitalization: No Has patient had a PCN reaction occurring within the last 10 years: No If all of the above answers are "NO", then may proceed with Cephalosporin use.     Past Medical History:  Diagnosis Date  . CAD (coronary artery disease)    Non obstructive (30-40% Circ, 40% RCA AM,2008)  . CHF (congestive heart failure) ([place])    . Depression   . Dyshidrotic eczema   . ED (erectile dysfunction)   . Gastritis and duodenitis   . GERD (gastroesophageal reflux disease)    improved   . Heart failure    EF 40%, Echo 01-2009  . OA (osteoarthritis)   . PONV (postoperative nausea and vomiting)    past hx of PONV if under anesthesia a long period   . Renal disorder    kidney stones  . RLS (restless legs syndrome)   . Sleep disturbance    due to RLS    Past Surgical History:  Procedure Laterality Date  . CARPAL TUNNEL RELEASE Bilateral 05/2003   Right ([place])  . CATARACT EXTRACTION  10/2008   Left  . CATARACT EXTRACTION  11/2008   Right  . COLONOSCOPY    . COLONOSCOPY WITH PROPOFOL N/A 02/12/2018   Procedure: COLONOSCOPY WITH PROPOFOL;  Surgeon: Milus Banister, MD;  Location: [facility];  Service: Endoscopy;  Laterality: N/A;  . ESOPHAGOGASTRIC FUNDOPLICATION  99991111   scope-[facility]  . KIDNEY STONE SURGERY  1997/2000  . LAPAROSCOPIC CHOLECYSTECTOMY  04/2009   Dr. Johney Maine  . POLYPECTOMY  02/12/2018   Procedure: POLYPECTOMY;  Surgeon: Milus Banister, MD;  Location: [facility];  Service: Endoscopy;;  . UPPER GASTROINTESTINAL ENDOSCOPY    . URETEROSCOPY  05/2008   [facility]    Family History  Problem Relation Age of Onset  . Heart attack Father        4 total and bypass  . Heart disease Father   . Cancer Mother  unknown type  . Arthritis Sister   . Colon cancer Neg Hx   . Colon polyps Neg Hx   . Esophageal cancer Neg Hx   . Rectal cancer Neg Hx   . Stomach cancer Neg Hx     Social History   Socioeconomic History  . Marital status: Married    Spouse name: Not on file  . Number of children: 3  . Years of education: Not on file  . Highest education level: Not on file  Occupational History  . Occupation: Retired    Comment: Airline pilot. Counsellor  . Occupation: PARK MANAGER    Employer: [employer]    Comment: [place]--part time  Tobacco Use  .  Smoking status: Never Smoker  . Smokeless tobacco: Never Used  Substance and Sexual Activity  . Alcohol use: Yes    Comment: occasional  . Drug use: Never  . Sexual activity: Not on file  Other Topics Concern  . Not on file  Social History Narrative   Divorced then remarried 5/12   Has living will   Requests wife then  nephew [name] as his health care POA.    Would accept resuscitation attempts   Probably would accept a feeding tube--at least for some time   Social Determinants of Health   Financial Resource Strain:   . Difficulty of Paying Living Expenses: Not on file  Food Insecurity:   . Worried About Charity fundraiser in the Last Year: Not on file  . Ran Out of Food in the Last Year: Not on file  Transportation Needs:   . Lack of Transportation (Medical): Not on file  . Lack of Transportation (Non-Medical): Not on file  Physical Activity:   . Days of Exercise per Week: Not on file  . Minutes of Exercise per Session: Not on file  Stress:   . Feeling of Stress : Not on file  Social Connections:   . Frequency of Communication with Friends and Family: Not on file  . Frequency of Social Gatherings with Friends and Family: Not on file  . Attends Religious Services: Not on file  . Active Member of Clubs or Organizations: Not on file  . Attends Archivist Meetings: Not on file  . Marital Status: Not on file  Intimate Partner Violence:   . Fear of Current or Ex-Partner: Not on file  . Emotionally Abused: Not on file  . Physically Abused: Not on file  . Sexually Abused: Not on file   Review of Systems Appetite is fine  Weight is up some Chronic sleep problems--no change Wears seat belt Teeth okay No sig back or joint pains (just the chronic knee pain--uses tylenol) Bowels are fine--no blood Voids okay. Stream is fine. Nocturia x 1 at most.    Objective:   Physical Exam  Constitutional: He is oriented to person, place, and time. He appears  well-developed. No distress.  HENT:  Mouth/Throat: Oropharynx is clear and moist. No oropharyngeal exudate.  Neck: No thyromegaly present.  Cardiovascular: Normal rate, regular rhythm and normal heart sounds. Exam reveals no gallop.  No murmur heard. Normal pulse right foot, faint on left  Respiratory: Effort normal and breath sounds normal. No respiratory distress. He has no wheezes. He has no rales.  GI: Soft. There is no abdominal tenderness.  Musculoskeletal:        General: No tenderness or edema.  Lymphadenopathy:    He has no cervical adenopathy.  Neurological: He is alert and oriented to person, place, and time.  President---"Trump, Obama, Bush" [id number]---some trouble Recall-- 2/3  Skin: No rash noted.  Suspicious subcutaneous mass and on surface---he will set up with Dr Nehemiah Massed for apparent skin cancer  Psychiatric: He has a normal mood and affect. His behavior is normal.           Assessment & Plan:

## 2019-06-16 NOTE — Assessment & Plan Note (Signed)
BP Readings from Last 3 Encounters:  06/16/19 126/84  05/30/18 (!) 152/93  02/12/18 133/72   He checks at home occasionally Has been fine No headaches, etc Will check labs

## 2019-06-16 NOTE — Patient Instructions (Signed)
DASH Eating Plan DASH stands for "Dietary Approaches to Stop Hypertension." The DASH eating plan is a healthy eating plan that has been shown to reduce high blood pressure (hypertension). It may also reduce your risk for type 2 diabetes, heart disease, and stroke. The DASH eating plan may also help with weight loss. What are tips for following this plan?  General guidelines  Avoid eating more than 2,300 mg (milligrams) of salt (sodium) a day. If you have hypertension, you may need to reduce your sodium intake to 1,500 mg a day.  Limit alcohol intake to no more than 1 drink a day for nonpregnant women and 2 drinks a day for men. One drink equals 12 oz of beer, 5 oz of wine, or 1 oz of hard liquor.  Work with your health care provider to maintain a healthy body weight or to lose weight. Ask what an ideal weight is for you.  Get at least 30 minutes of exercise that causes your heart to beat faster (aerobic exercise) most days of the week. Activities may include walking, swimming, or biking.  Work with your health care provider or diet and nutrition specialist (dietitian) to adjust your eating plan to your individual calorie needs. Reading food labels   Check food labels for the amount of sodium per serving. Choose foods with less than 5 percent of the Daily Value of sodium. Generally, foods with less than 300 mg of sodium per serving fit into this eating plan.  To find whole grains, look for the word "whole" as the first word in the ingredient list. Shopping  Buy products labeled as "low-sodium" or "no salt added."  Buy fresh foods. Avoid canned foods and premade or frozen meals. Cooking  Avoid adding salt when cooking. Use salt-free seasonings or herbs instead of table salt or sea salt. Check with your health care provider or pharmacist before using salt substitutes.  Do not fry foods. Cook foods using healthy methods such as baking, boiling, grilling, and broiling instead.  Cook with  heart-healthy oils, such as olive, canola, soybean, or sunflower oil. Meal planning  Eat a balanced diet that includes: ? 5 or more servings of fruits and vegetables each day. At each meal, try to fill half of your plate with fruits and vegetables. ? Up to 6-8 servings of whole grains each day. ? Less than 6 oz of lean meat, poultry, or fish each day. A 3-oz serving of meat is about the same size as a deck of cards. One egg equals 1 oz. ? 2 servings of low-fat dairy each day. ? A serving of nuts, seeds, or beans 5 times each week. ? Heart-healthy fats. Healthy fats called Omega-3 fatty acids are found in foods such as flaxseeds and coldwater fish, like sardines, salmon, and mackerel.  Limit how much you eat of the following: ? Canned or prepackaged foods. ? Food that is high in trans fat, such as fried foods. ? Food that is high in saturated fat, such as fatty meat. ? Sweets, desserts, sugary drinks, and other foods with added sugar. ? Full-fat dairy products.  Do not salt foods before eating.  Try to eat at least 2 vegetarian meals each week.  Eat more home-cooked food and less restaurant, buffet, and fast food.  When eating at a restaurant, ask that your food be prepared with less salt or no salt, if possible. What foods are recommended? The items listed may not be a complete list. Talk with your dietitian about   what dietary choices are best for you. Grains Whole-grain or whole-wheat bread. Whole-grain or whole-wheat pasta. Brown rice. Oatmeal. Quinoa. Bulgur. Whole-grain and low-sodium cereals. Pita bread. Low-fat, low-sodium crackers. Whole-wheat flour tortillas. Vegetables Fresh or frozen vegetables (raw, steamed, roasted, or grilled). Low-sodium or reduced-sodium tomato and vegetable juice. Low-sodium or reduced-sodium tomato sauce and tomato paste. Low-sodium or reduced-sodium canned vegetables. Fruits All fresh, dried, or frozen fruit. Canned fruit in natural juice (without  added sugar). Meat and other protein foods Skinless chicken or turkey. Ground chicken or turkey. Pork with fat trimmed off. Fish and seafood. Egg whites. Dried beans, peas, or lentils. Unsalted nuts, nut butters, and seeds. Unsalted canned beans. Lean cuts of beef with fat trimmed off. Low-sodium, lean deli meat. Dairy Low-fat (1%) or fat-free (skim) milk. Fat-free, low-fat, or reduced-fat cheeses. Nonfat, low-sodium ricotta or cottage cheese. Low-fat or nonfat yogurt. Low-fat, low-sodium cheese. Fats and oils Soft margarine without trans fats. Vegetable oil. Low-fat, reduced-fat, or light mayonnaise and salad dressings (reduced-sodium). Canola, safflower, olive, soybean, and sunflower oils. Avocado. Seasoning and other foods Herbs. Spices. Seasoning mixes without salt. Unsalted popcorn and pretzels. Fat-free sweets. What foods are not recommended? The items listed may not be a complete list. Talk with your dietitian about what dietary choices are best for you. Grains Baked goods made with fat, such as croissants, muffins, or some breads. Dry pasta or rice meal packs. Vegetables Creamed or fried vegetables. Vegetables in a cheese sauce. Regular canned vegetables (not low-sodium or reduced-sodium). Regular canned tomato sauce and paste (not low-sodium or reduced-sodium). Regular tomato and vegetable juice (not low-sodium or reduced-sodium). Pickles. Olives. Fruits Canned fruit in a light or heavy syrup. Fried fruit. Fruit in cream or butter sauce. Meat and other protein foods Fatty cuts of meat. Ribs. Fried meat. Bacon. Sausage. Bologna and other processed lunch meats. Salami. Fatback. Hotdogs. Bratwurst. Salted nuts and seeds. Canned beans with added salt. Canned or smoked fish. Whole eggs or egg yolks. Chicken or turkey with skin. Dairy Whole or 2% milk, cream, and half-and-half. Whole or full-fat cream cheese. Whole-fat or sweetened yogurt. Full-fat cheese. Nondairy creamers. Whipped toppings.  Processed cheese and cheese spreads. Fats and oils Butter. Stick margarine. Lard. Shortening. Ghee. Bacon fat. Tropical oils, such as coconut, palm kernel, or palm oil. Seasoning and other foods Salted popcorn and pretzels. Onion salt, garlic salt, seasoned salt, table salt, and sea salt. Worcestershire sauce. Tartar sauce. Barbecue sauce. Teriyaki sauce. Soy sauce, including reduced-sodium. Steak sauce. Canned and packaged gravies. Fish sauce. Oyster sauce. Cocktail sauce. Horseradish that you find on the shelf. Ketchup. Mustard. Meat flavorings and tenderizers. Bouillon cubes. Hot sauce and Tabasco sauce. Premade or packaged marinades. Premade or packaged taco seasonings. Relishes. Regular salad dressings. Where to find more information:  National Heart, Lung, and Blood Institute: www.nhlbi.nih.gov  American Heart Association: www.heart.org Summary  The DASH eating plan is a healthy eating plan that has been shown to reduce high blood pressure (hypertension). It may also reduce your risk for type 2 diabetes, heart disease, and stroke.  With the DASH eating plan, you should limit salt (sodium) intake to 2,300 mg a day. If you have hypertension, you may need to reduce your sodium intake to 1,500 mg a day.  When on the DASH eating plan, aim to eat more fresh fruits and vegetables, whole grains, lean proteins, low-fat dairy, and heart-healthy fats.  Work with your health care provider or diet and nutrition specialist (dietitian) to adjust your eating plan to your   individual calorie needs. This information is not intended to replace advice given to you by your health care provider. Make sure you discuss any questions you have with your health care provider. Document Revised: 05/09/2017 Document Reviewed: 05/20/2016 Elsevier Patient Education  2020 [employer] Inc.  

## 2019-06-16 NOTE — Assessment & Plan Note (Signed)
I have personally reviewed the Medicare Annual Wellness questionnaire and have noted 1. The patient's medical and social history 2. Their use of alcohol, tobacco or illicit drugs 3. Their current medications and supplements 4. The patient's functional ability including ADL's, fall risks, home safety risks and hearing or visual             impairment. 5. Diet and physical activities 6. Evidence for depression or mood disorders  The patients weight, height, BMI and visual acuity have been recorded in the chart I have made referrals, counseling and provided education to the patient based review of the above and I have provided the pt with a written personalized care plan for preventive services.  I have provided you with a copy of your personalized plan for preventive services. Please take the time to review along with your updated medication list.  Done with colons No PSA due to age Will get COVID vaccine soon Yearly flu vaccine Consider shingrix at pharmacy

## 2019-06-16 NOTE — Assessment & Plan Note (Signed)
EF down to 25-30% on echo 8/19 Only rare ankle edema No chest pain or SOB No dizziness or syncope No orthopnea or PND  Will hold off on Rx due to lack of symptoms Will recheck echo--if still that low, will set up with cardiology

## 2019-06-16 NOTE — Assessment & Plan Note (Signed)
Ongoing disturbance to his sleep Did have past meds for this--did help Will retry clonazepam at lower dose and prn

## 2019-06-16 NOTE — Progress Notes (Signed)
Hearing Screening   Method: Audiometry   125Hz  250Hz  500Hz  1000Hz  2000Hz  3000Hz  4000Hz  6000Hz  8000Hz   Right ear:   40 0 40  0    Left ear:   40 0 0  0      Visual Acuity Screening   Right eye Left eye Both eyes  Without correction: 20/40 20/25 20/25   With correction:

## 2019-06-21 ENCOUNTER — Ambulatory Visit (HOSPITAL_COMMUNITY): Payer: Medicare Other | Attending: Cardiovascular Disease

## 2019-06-21 ENCOUNTER — Other Ambulatory Visit: Payer: Self-pay

## 2019-06-21 DIAGNOSIS — I255 Ischemic cardiomyopathy: Secondary | ICD-10-CM | POA: Diagnosis not present

## 2019-06-21 DIAGNOSIS — I420 Dilated cardiomyopathy: Secondary | ICD-10-CM | POA: Insufficient documentation

## 2019-06-21 MED ORDER — PERFLUTREN LIPID MICROSPHERE
1.0000 mL | INTRAVENOUS | Status: AC | PRN
  Administered 2019-06-21: 2 mL via INTRAVENOUS

## 2019-06-22 ENCOUNTER — Other Ambulatory Visit: Payer: Self-pay | Admitting: Internal Medicine

## 2019-06-22 DIAGNOSIS — I255 Ischemic cardiomyopathy: Secondary | ICD-10-CM

## 2019-07-20 ENCOUNTER — Encounter: Payer: Self-pay | Admitting: Internal Medicine

## 2019-07-20 ENCOUNTER — Other Ambulatory Visit: Payer: Self-pay

## 2019-07-20 ENCOUNTER — Ambulatory Visit (INDEPENDENT_AMBULATORY_CARE_PROVIDER_SITE_OTHER): Payer: Medicare Other | Admitting: Internal Medicine

## 2019-07-20 DIAGNOSIS — I255 Ischemic cardiomyopathy: Secondary | ICD-10-CM | POA: Diagnosis not present

## 2019-07-20 DIAGNOSIS — M25511 Pain in right shoulder: Secondary | ICD-10-CM

## 2019-07-20 DIAGNOSIS — I420 Dilated cardiomyopathy: Secondary | ICD-10-CM | POA: Diagnosis not present

## 2019-07-20 HISTORY — DX: Pain in right shoulder: M25.511

## 2019-07-20 NOTE — Assessment & Plan Note (Signed)
Mild impingement Findings don't suggest torn rotator cuff or sig arthritis as cause Could be nerve impingement (but doesn't seem to be from neck)  Discussed trying heat Continue tylenol ROM Would consider PT if not improving in 1-2 weeks

## 2019-07-20 NOTE — Progress Notes (Signed)
Subjective:    Patient ID: [facility], male    DOB: [birth year], 79 y.o.   MRN: [id number]  HPI Here due to right shoulder pain and arm numbness This visit occurred during the SARS-CoV-2 public health emergency.  Safety protocols were in place, including screening questions prior to the visit, additional usage of staff PPE, and extensive cleaning of exam room while observing appropriate contact time as indicated for disinfecting solutions.   Woke 2 mornings ago and couldn't move right arm right Arm feels asleep Some trouble controlling arm movement Shoulder pain when he raises his arm  No known injury Has been stable Tried an OTC ointment--no help Takes tylenol before bed--has been able to sleep  Current Outpatient Medications on File Prior to Visit  Medication Sig Dispense Refill  . acetaminophen (TYLENOL 8 HOUR) 650 MG CR tablet Take 1,300 mg by mouth 2 (two) times daily.     . clonazePAM (KLONOPIN) 0.5 MG tablet Take 1-2 tablets (0.5-1 mg total) by mouth at bedtime as needed for anxiety. 60 tablet 0   No current facility-administered medications on file prior to visit.    Allergies  Allergen Reactions  . Penicillins Hives    Has patient had a PCN reaction causing immediate rash, facial/tongue/throat swelling, SOB or lightheadedness with hypotension: Yes Has patient had a PCN reaction causing severe rash involving mucus membranes or skin necrosis: No Has patient had a PCN reaction that required hospitalization: No Has patient had a PCN reaction occurring within the last 10 years: No If all of the above answers are "NO", then may proceed with Cephalosporin use.     Past Medical History:  Diagnosis Date  . CAD (coronary artery disease)    Non obstructive (30-40% Circ, 40% RCA AM,2008)  . CHF (congestive heart failure) ([place])   . Depression   . Dyshidrotic eczema   . ED (erectile dysfunction)   . Gastritis and duodenitis   . GERD (gastroesophageal reflux disease)    improved   . Heart failure    EF 40%, Echo 01-2009  . OA (osteoarthritis)   . PONV (postoperative nausea and vomiting)    past hx of PONV if under anesthesia a long period   . Renal disorder    kidney stones  . RLS (restless legs syndrome)   . Sleep disturbance    due to RLS    Past Surgical History:  Procedure Laterality Date  . CARPAL TUNNEL RELEASE Bilateral 05/2003   Right ([place])  . CATARACT EXTRACTION  10/2008   Left  . CATARACT EXTRACTION  11/2008   Right  . COLONOSCOPY    . COLONOSCOPY WITH PROPOFOL N/A 02/12/2018   Procedure: COLONOSCOPY WITH PROPOFOL;  Surgeon: Milus Banister, MD;  Location: [facility];  Service: Endoscopy;  Laterality: N/A;  . ESOPHAGOGASTRIC FUNDOPLICATION  99991111   scope-[facility]  . KIDNEY STONE SURGERY  1997/2000  . LAPAROSCOPIC CHOLECYSTECTOMY  04/2009   Dr. Johney Maine  . POLYPECTOMY  02/12/2018   Procedure: POLYPECTOMY;  Surgeon: Milus Banister, MD;  Location: [facility];  Service: Endoscopy;;  . UPPER GASTROINTESTINAL ENDOSCOPY    . URETEROSCOPY  05/2008   [place]    Family History  Problem Relation Age of Onset  . Heart attack Father        4 total and bypass  . Heart disease Father   . Cancer Mother        unknown type  . Arthritis Sister   . Colon cancer Neg Hx   .  Colon polyps Neg Hx   . Esophageal cancer Neg Hx   . Rectal cancer Neg Hx   . Stomach cancer Neg Hx     Social History   Socioeconomic History  . Marital status: Married    Spouse name: Not on file  . Number of children: 3  . Years of education: Not on file  . Highest education level: Not on file  Occupational History  . Occupation: Retired    Comment: Airline pilot. Counsellor  . Occupation: PARK MANAGER    Employer: [employer]    Comment: [place] [employer]--part time  Tobacco Use  . Smoking status: Never Smoker  . Smokeless tobacco: Never Used  Substance and Sexual Activity  . Alcohol use: Yes    Comment: occasional  . Drug  use: Never  . Sexual activity: Not on file  Other Topics Concern  . Not on file  Social History Narrative   Divorced then remarried 5/12      Has living will   Requests wife as his health care POA. No alternate at present   Would accept resuscitation attempts   Probably would accept a feeding tube--at least for some time   Social Determinants of Health   Financial Resource Strain:   . Difficulty of Paying Living Expenses: Not on file  Food Insecurity:   . Worried About Charity fundraiser in the Last Year: Not on file  . Ran Out of Food in the Last Year: Not on file  Transportation Needs:   . Lack of Transportation (Medical): Not on file  . Lack of Transportation (Non-Medical): Not on file  Physical Activity:   . Days of Exercise per Week: Not on file  . Minutes of Exercise per Session: Not on file  Stress:   . Feeling of Stress : Not on file  Social Connections:   . Frequency of Communication with Friends and Family: Not on file  . Frequency of Social Gatherings with Friends and Family: Not on file  . Attends Religious Services: Not on file  . Active Member of Clubs or Organizations: Not on file  . Attends Archivist Meetings: Not on file  . Marital Status: Not on file  Intimate Partner Violence:   . Fear of Current or Ex-Partner: Not on file  . Emotionally Abused: Not on file  . Physically Abused: Not on file  . Sexually Abused: Not on file   Review of Systems  No fever Not sick     Objective:   Physical Exam  Constitutional: No distress.  Neck:  No tenderness  Musculoskeletal:     Cervical back: Normal range of motion.     Comments: No right shoulder swelling No bursa tenderness Active abduction to 90 degrees (less passively though) Mild decrease in internal and external rotation Resists full passive ROM  Neurological:  Normal strength in right arm/hand--symmetric           Assessment & Plan:

## 2019-07-25 ENCOUNTER — Ambulatory Visit: Payer: Medicare Other

## 2019-07-28 ENCOUNTER — Other Ambulatory Visit: Payer: Self-pay

## 2019-07-28 ENCOUNTER — Ambulatory Visit (INDEPENDENT_AMBULATORY_CARE_PROVIDER_SITE_OTHER): Payer: Medicare Other | Admitting: Cardiology

## 2019-07-28 ENCOUNTER — Encounter: Payer: Self-pay | Admitting: Cardiology

## 2019-07-28 VITALS — BP 134/80 | HR 61 | Ht 71.5 in | Wt 231.0 lb

## 2019-07-28 DIAGNOSIS — I420 Dilated cardiomyopathy: Secondary | ICD-10-CM | POA: Diagnosis not present

## 2019-07-28 DIAGNOSIS — I255 Ischemic cardiomyopathy: Secondary | ICD-10-CM

## 2019-07-28 DIAGNOSIS — I700 Atherosclerosis of aorta: Secondary | ICD-10-CM

## 2019-07-28 DIAGNOSIS — I10 Essential (primary) hypertension: Secondary | ICD-10-CM | POA: Diagnosis not present

## 2019-07-28 DIAGNOSIS — I447 Left bundle-branch block, unspecified: Secondary | ICD-10-CM | POA: Diagnosis not present

## 2019-07-28 DIAGNOSIS — I5022 Chronic systolic (congestive) heart failure: Secondary | ICD-10-CM | POA: Diagnosis not present

## 2019-07-28 MED ORDER — SACUBITRIL-VALSARTAN 24-26 MG PO TABS: 1.0000 | ORAL_TABLET | Freq: Two times a day (BID) | ORAL | 6 refills | Status: DC

## 2019-07-28 MED ORDER — METOPROLOL SUCCINATE ER 25 MG PO TB24: 25.0000 mg | ORAL_TABLET | Freq: Every day | ORAL | 3 refills | Status: DC

## 2019-07-28 NOTE — Patient Instructions (Addendum)
Medication Instructions:  Please start Metoprolol succinate 25 mg a day. Start Entresto 24-26 mg one tablet twice a day. Continue all other medications as listed.  *If you need a refill on your cardiac medications before your next appointment, please call your pharmacy*  Follow-Up: At [facility], you and your health needs are our priority.  As part of our continuing mission to provide you with exceptional heart care, we have created designated Provider Care Teams.  These Care Teams include your primary Cardiologist (physician) and Advanced Practice Providers (APPs -  Physician Assistants and Nurse Practitioners) who all work together to provide you with the care you need, when you need it.  Your next appointment:   4 week(s)  The format for your next appointment:   In Person  Provider:   Candee Furbish, MD  Thank you for choosing [facility]!!     Heart Failure, Diagnosis  Heart failure means that your heart is not able to pump blood in the right way. This makes it hard for your body to work well. Heart failure is usually a long-term (chronic) condition. You must take good care of yourself and follow your treatment plan from your doctor. What are the causes? This condition may be caused by:  High blood pressure.  Build up of cholesterol and fat in the arteries.  Heart attack. This injures the heart muscle.  Heart valves that do not open and close properly.  Damage of the heart muscle. This is also called cardiomyopathy.  Lung disease.  Abnormal heart rhythms. What increases the risk? The risk of heart failure goes up as a person ages. This condition is also more likely to develop in people who:  Are overweight.  Are male.  Smoke or chew tobacco.  Abuse alcohol or illegal drugs.  Have taken medicines that can damage the heart.  Have diabetes.  Have abnormal heart rhythms.  Have thyroid problems.  Have low blood counts (anemia). What are the  signs or symptoms? Symptoms of this condition include:  Shortness of breath.  Coughing.  Swelling of the feet, ankles, legs, or belly.  Losing weight for no reason.  Trouble breathing.  Waking from sleep because of the need to sit up and get more air.  Rapid heartbeat.  Being very tired.  Feeling dizzy, or feeling like you may pass out (faint).  Having no desire to eat.  Feeling like you may vomit (nauseous).  Peeing (urinating) more at night.  Feeling confused. How is this treated?     This condition may be treated with:  Medicines. These can be given to treat blood pressure and to make the heart muscles stronger.  Changes in your daily life. These may include eating a healthy diet, staying at a healthy body weight, quitting tobacco and illegal drug use, or doing exercises.  Surgery. Surgery can be done to open blocked valves, or to put devices in the heart, such as pacemakers.  A donor heart (heart transplant). You will receive a healthy heart from a donor. Follow these instructions at home:  Treat other conditions as told by your doctor. These may include high blood pressure, diabetes, thyroid disease, or abnormal heart rhythms.  Learn as much as you can about heart failure.  Get support as you need it.  Keep all follow-up visits as told by your doctor. This is important. Summary  Heart failure means that your heart is not able to pump blood in the right way.  This condition is  caused by high blood pressure, heart attack, or damage of the heart muscle.  Symptoms of this condition include shortness of breath and swelling of the feet, ankles, legs, or belly. You may also feel very tired or feel like you may vomit.  You may be treated with medicines, surgery, or changes in your daily life.  Treat other health conditions as told by your doctor. This information is not intended to replace advice given to you by your health care provider. Make sure you  discuss any questions you have with your health care provider. Document Revised: 08/14/2018 Document Reviewed: 08/14/2018 Elsevier Patient Education  [place].

## 2019-07-28 NOTE — Progress Notes (Signed)
Cardiology Office Note:    Date:  07/28/2019   ID:  Caleb Chavez, DOB 1940/08/06, MRN [id number]  PCP:  [name], MD  Cardiologist:  Candee Furbish, MD  Electrophysiologist:  None   Referring MD: [name], MD    History of Present Illness:    Caleb Chavez is a 79 y.o. male with history of cardiomyopathy previously seen by Dr. Percival Spanish and Dr. Burt Knack, approximately 10 years ago here for new patient visit.  His father has had several heart attacks, stents, pacemaker.  Family history of CAD.  Ejection fraction remains 20% in recent echocardiogram 06/21/2019. 2010 EF 35%  Feels pretty good. In evenings mild SOB and tired. No chest pain.  Non obstructive CAD (30-40% Circ, 40% RCA AM, 2008)  Both ankles in evenings a few months ago increased edema.   No syncope.  No sensation of racing heart.  No significant orthopnea.  Non-smoker.  No significant alcohol use.  Past Medical History:  Diagnosis Date  . Actinic keratosis 07/14/2012  . CAD (coronary artery disease)    Non obstructive (30-40% Circ, 40% RCA AM,2008)  . CHF (congestive heart failure) ([place])   . Depression   . Dyshidrotic eczema   . DYSHIDROTIC ECZEMA 10/20/2006   Qualifier: Diagnosis of  By: Lurlean Nanny LPN, [place]    . ED (erectile dysfunction)   . Eustachian tube dysfunction 08/25/2017  . Gastritis and duodenitis   . GERD 10/20/2006   Qualifier: Diagnosis of  By: Lurlean Nanny LPN, Regina    . GERD (gastroesophageal reflux disease)    improved   . Heart failure    EF 40%, Echo 01-2009  . 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 OA (osteoarthritis)   . 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 Polyp of sigmoid colon   . Polyp of transverse colon   . PONV (postoperative nausea and vomiting)    past hx of PONV if under anesthesia a long period   . Renal disorder    kidney stones  . RESTLESS LEG SYNDROME 10/20/2006   Qualifier: Diagnosis of  By: Lurlean Nanny LPN, Regina    . Right shoulder pain 07/20/2019  . RLS (restless legs syndrome)   . Sleep disturbance    due to RLS    Past Surgical History:  Procedure Laterality Date  . CARPAL TUNNEL RELEASE Bilateral 05/2003   Right ([place])  . CATARACT EXTRACTION  10/2008   Left  . CATARACT EXTRACTION  11/2008   Right  . COLONOSCOPY    . COLONOSCOPY WITH PROPOFOL N/A 02/12/2018   Procedure: COLONOSCOPY WITH PROPOFOL;  Surgeon: Milus Banister, MD;  Location: [facility];  Service: Endoscopy;  Laterality: N/A;  . ESOPHAGOGASTRIC FUNDOPLICATION  99991111   scope-[facility]  . KIDNEY STONE SURGERY  1997/2000  . LAPAROSCOPIC CHOLECYSTECTOMY  04/2009   Dr. Johney Maine  . POLYPECTOMY  02/12/2018   Procedure: POLYPECTOMY;  Surgeon: Milus Banister, MD;  Location: [facility];  Service: Endoscopy;;  . UPPER GASTROINTESTINAL ENDOSCOPY    . URETEROSCOPY  05/2008   [facility]    Current Medications: Current Meds  Medication Sig  . acetaminophen (TYLENOL 8 HOUR) 650 MG CR tablet Take 1,300 mg by mouth 2 (two) times daily.   . clonazePAM (KLONOPIN) 0.5 MG tablet Take 1-2 tablets (0.5-1 mg total) by mouth at bedtime as needed for anxiety.     Allergies:  Penicillins   Social History   Socioeconomic History  . Marital status: Married    Spouse name: Not on file  . Number of children: 3  . Years of education: Not on file  . Highest education level: Not on file  Occupational History  . Occupation: Retired    Comment: Airline pilot. Counsellor  . Occupation: PARK MANAGER    Employer: [employer]    Comment: [place] [employer]--part time  Tobacco Use  . Smoking status: Never Smoker  . Smokeless tobacco: Never Used  Substance and Sexual Activity  . Alcohol use: Yes    Comment: occasional  . Drug use: Never  . Sexual activity: Not on file  Other Topics Concern  . Not on file  Social History Narrative   Divorced then remarried 5/12      Has  living will   Requests wife as his health care POA. No alternate at present   Would accept resuscitation attempts   Probably would accept a feeding tube--at least for some time   Social Determinants of Health   Financial Resource Strain:   . Difficulty of Paying Living Expenses: Not on file  Food Insecurity:   . Worried About Charity fundraiser in the Last Year: Not on file  . Ran Out of Food in the Last Year: Not on file  Transportation Needs:   . Lack of Transportation (Medical): Not on file  . Lack of Transportation (Non-Medical): Not on file  Physical Activity:   . Days of Exercise per Week: Not on file  . Minutes of Exercise per Session: Not on file  Stress:   . Feeling of Stress : Not on file  Social Connections:   . Frequency of Communication with Friends and Family: Not on file  . Frequency of Social Gatherings with Friends and Family: Not on file  . Attends Religious Services: Not on file  . Active Member of Clubs or Organizations: Not on file  . Attends Archivist Meetings: Not on file  . Marital Status: Not on file     Family History: The patient's family history includes Arthritis in his sister; Cancer in his mother; Heart attack in his father; Heart disease in his father. There is no history of Colon cancer, Colon polyps, Esophageal cancer, Rectal cancer, or Stomach cancer.  ROS:   Please see the history of present illness.     All other systems reviewed and are negative.  EKGs/Labs/Other Studies Reviewed:    The following studies were reviewed today: As above  EKG:  EKG is  ordered today.  The ekg ordered today demonstrates sinus rhythm capital branch block 61 bpm with widened QRS 180 ms.  Recent Labs: 06/16/2019: ALT 19; BUN 16; Creatinine, Ser 1.22; Hemoglobin 14.4; Platelets 248.0; Potassium 4.8; Sodium 140  Recent Lipid Panel    Component Value Date/Time   CHOL 124 06/16/2019 1035   TRIG 79.0 06/16/2019 1035   HDL 41.50 06/16/2019 1035    CHOLHDL 3 06/16/2019 1035   VLDL 15.8 06/16/2019 1035   LDLCALC 67 06/16/2019 1035    Physical Exam:    VS:  BP 134/80   Pulse 61   Ht 5' 11.5" (1.816 m)   Wt 231 lb (104.8 kg)   SpO2 99%   BMI 31.77 kg/m     Wt Readings from Last 3 Encounters:  07/28/19 231 lb (104.8 kg)  07/20/19 228 lb (103.4 kg)  06/16/19 230 lb (104.3 kg)  GEN:  Well nourished, well developed in no acute distress HEENT: Normal NECK: No JVD; No carotid bruits LYMPHATICS: No lymphadenopathy CARDIAC: RRR, no murmurs, rubs, gallops RESPIRATORY:  Clear to auscultation without rales, wheezing or rhonchi  ABDOMEN: Soft, non-tender, non-distended MUSCULOSKELETAL:  No edema; No deformity  SKIN: Warm and dry NEUROLOGIC:  Alert and oriented x 3 PSYCHIATRIC:  Normal affect   No significant ankle edema  ASSESSMENT:    1. Essential hypertension, benign   2. Aortic atherosclerosis ([place])   3. Chronic systolic heart failure ([place])   4. Left bundle branch block    PLAN:    In order of problems listed above:  Chronic systolic heart failure -EF has been reduced for several years.  Less recent echocardiogram however shows EF of 25%. -Symptoms of mild shortness of breath with activity, NYHA class II-like symptoms. -We will go ahead and initiate Entresto 24/26 twice daily. -I will also use low-dose Toprol-XL 25 mg once a day being careful with his underlying left bundle branch block, heart rate of 61 bpm. -He is not complaining of any anginal symptoms.  In 2008 he had mild nonobstructive coronary artery disease.  I think at this point I will continue with medical management unless symptoms change. -Given his age, reduced ejection fraction, he is at increased risk for sudden cardiac death.  Aortic atherosclerosis -Seen personally on CT scan review of abdomen most recently done.  Normally I would start statin therapy on him however he has not been taking any medications and I did not want to overload him with too  many things at once.   Medication Adjustments/Labs and Tests Ordered: Current medicines are reviewed at length with the patient today.  Concerns regarding medicines are outlined above.  Orders Placed This Encounter  Procedures  . EKG 12-Lead   Meds ordered this encounter  Medications  . metoprolol succinate (TOPROL-XL) 25 MG 24 hr tablet    Sig: Take 1 tablet (25 mg total) by mouth daily.    Dispense:  90 tablet    Refill:  3  . sacubitril-valsartan (ENTRESTO) 24-26 MG    Sig: Take 1 tablet by mouth 2 (two) times daily.    Dispense:  60 tablet    Refill:  6    Patient Instructions  Medication Instructions:  Please start Metoprolol succinate 25 mg a day. Start Entresto 24-26 mg one tablet twice a day. Continue all other medications as listed.  *If you need a refill on your cardiac medications before your next appointment, please call your pharmacy*  Follow-Up: At [facility], you and your health needs are our priority.  As part of our continuing mission to provide you with exceptional heart care, we have created designated Provider Care Teams.  These Care Teams include your primary Cardiologist (physician) and Advanced Practice Providers (APPs -  Physician Assistants and Nurse Practitioners) who all work together to provide you with the care you need, when you need it.  Your next appointment:   4 week(s)  The format for your next appointment:   In Person  Provider:   Candee Furbish, MD  Thank you for choosing [facility]!!     Heart Failure, Diagnosis  Heart failure means that your heart is not able to pump blood in the right way. This makes it hard for your body to work well. Heart failure is usually a long-term (chronic) condition. You must take good care of yourself and follow your treatment plan from your doctor. What are  the causes? This condition may be caused by:  High blood pressure.  Build up of cholesterol and fat in the arteries.  Heart  attack. This injures the heart muscle.  Heart valves that do not open and close properly.  Damage of the heart muscle. This is also called cardiomyopathy.  Lung disease.  Abnormal heart rhythms. What increases the risk? The risk of heart failure goes up as a person ages. This condition is also more likely to develop in people who:  Are overweight.  Are male.  Smoke or chew tobacco.  Abuse alcohol or illegal drugs.  Have taken medicines that can damage the heart.  Have diabetes.  Have abnormal heart rhythms.  Have thyroid problems.  Have low blood counts (anemia). What are the signs or symptoms? Symptoms of this condition include:  Shortness of breath.  Coughing.  Swelling of the feet, ankles, legs, or belly.  Losing weight for no reason.  Trouble breathing.  Waking from sleep because of the need to sit up and get more air.  Rapid heartbeat.  Being very tired.  Feeling dizzy, or feeling like you may pass out (faint).  Having no desire to eat.  Feeling like you may vomit (nauseous).  Peeing (urinating) more at night.  Feeling confused. How is this treated?     This condition may be treated with:  Medicines. These can be given to treat blood pressure and to make the heart muscles stronger.  Changes in your daily life. These may include eating a healthy diet, staying at a healthy body weight, quitting tobacco and illegal drug use, or doing exercises.  Surgery. Surgery can be done to open blocked valves, or to put devices in the heart, such as pacemakers.  A donor heart (heart transplant). You will receive a healthy heart from a donor. Follow these instructions at home:  Treat other conditions as told by your doctor. These may include high blood pressure, diabetes, thyroid disease, or abnormal heart rhythms.  Learn as much as you can about heart failure.  Get support as you need it.  Keep all follow-up visits as told by your doctor. This is  important. Summary  Heart failure means that your heart is not able to pump blood in the right way.  This condition is caused by high blood pressure, heart attack, or damage of the heart muscle.  Symptoms of this condition include shortness of breath and swelling of the feet, ankles, legs, or belly. You may also feel very tired or feel like you may vomit.  You may be treated with medicines, surgery, or changes in your daily life.  Treat other health conditions as told by your doctor. This information is not intended to replace advice given to you by your health care provider. Make sure you discuss any questions you have with your health care provider. Document Revised: 08/14/2018 Document Reviewed: 08/14/2018 Elsevier Patient Education  [place].     Signed, Candee Furbish, MD  07/28/2019 10:33 AM    [place]

## 2019-08-25 ENCOUNTER — Encounter: Payer: Self-pay | Admitting: Cardiology

## 2019-08-25 ENCOUNTER — Other Ambulatory Visit: Payer: Self-pay

## 2019-08-25 ENCOUNTER — Ambulatory Visit (INDEPENDENT_AMBULATORY_CARE_PROVIDER_SITE_OTHER): Payer: Medicare Other | Admitting: Cardiology

## 2019-08-25 VITALS — BP 126/70 | HR 62 | Ht 71.5 in | Wt 229.0 lb

## 2019-08-25 DIAGNOSIS — I420 Dilated cardiomyopathy: Secondary | ICD-10-CM | POA: Diagnosis not present

## 2019-08-25 DIAGNOSIS — I255 Ischemic cardiomyopathy: Secondary | ICD-10-CM

## 2019-08-25 DIAGNOSIS — I447 Left bundle-branch block, unspecified: Secondary | ICD-10-CM

## 2019-08-25 DIAGNOSIS — I700 Atherosclerosis of aorta: Secondary | ICD-10-CM

## 2019-08-25 DIAGNOSIS — I5022 Chronic systolic (congestive) heart failure: Secondary | ICD-10-CM | POA: Diagnosis not present

## 2019-08-25 DIAGNOSIS — I10 Essential (primary) hypertension: Secondary | ICD-10-CM

## 2019-08-25 LAB — BASIC METABOLIC PANEL
BUN/Creatinine Ratio: 12 (ref 10–24)
BUN: 17 mg/dL (ref 8–27)
CO2: 23 mmol/L (ref 20–29)
Calcium: 9.4 mg/dL (ref 8.6–10.2)
Chloride: 102 mmol/L (ref 96–106)
Creatinine, Ser: 1.43 mg/dL — ABNORMAL HIGH (ref 0.76–1.27)
GFR calc Af Amer: 53 mL/min/{1.73_m2} — ABNORMAL LOW (ref >59)
GFR calc non Af Amer: 46 mL/min/{1.73_m2} — ABNORMAL LOW (ref >59)
Glucose: 127 mg/dL — ABNORMAL HIGH (ref 65–99)
Potassium: 4.5 mmol/L (ref 3.5–5.2)
Sodium: 137 mmol/L (ref 134–144)

## 2019-08-25 MED ORDER — FUROSEMIDE 20 MG PO TABS: 20.0000 mg | ORAL_TABLET | Freq: Every day | ORAL | 3 refills | Status: AC

## 2019-08-25 NOTE — Patient Instructions (Signed)
Medication Instructions:  Your physician has recommended you make the following change in your medication:  1) START Lasix (furosemide) 20 mg daily   *If you need a refill on your cardiac medications before your next appointment, please call your pharmacy*   Lab Work: TODAY: BMET If you have labs (blood work) drawn today and your tests are completely normal, you will receive your results only by: Marland Kitchen MyChart Message (if you have MyChart) OR . A paper copy in the mail If you have any lab test that is abnormal or we need to change your treatment, we will call you to review the results.   Follow-Up: At [facility], you and your health needs are our priority.  As part of our continuing mission to provide you with exceptional heart care, we have created designated [facility] Care Teams.  These Care Teams include your primary Cardiologist (physician) and Advanced Practice Providers (APPs -  Physician Assistants and Nurse Practitioners) who all work together to provide you with the care you need, when you need it.   Your next appointment:   1 month(s)  The format for your next appointment:   In Person  [place] Gottlieb:   Candee Furbish, MD

## 2019-08-25 NOTE — Progress Notes (Signed)
Cardiology Office Note:    Date:  08/25/2019   ID:  Caleb Chavez, DOB December 13, 1940, MRN [id number]  PCP:  [name], MD  Cardiologist:  Candee Furbish, MD  Electrophysiologist:  None   Referring MD: [name], MD     History of Present Illness:    Caleb Chavez is a 79 y.o. male here for follow-up of cardiomyopathy 20% EF in 2021 with mild nonobstructive coronary artery disease 40% circumflex 40% RCA in 2008.  Nonischemic cardiomyopathy.  Prior EF in 2010 was 35.  He had been lost for follow-up for approximately 10 years.  Has a strong family history with his father having several heart attacks, pacemaker.  No palpitations no chest pain no orthopnea non-smoker no alcohol.  Recently was dealing with some right shoulder pain and arm numbness from nerve impingement.  Dr. Silvio Pate has been helping him with this.  Sometimes will have ankle edema. Still thinks that feet are tight.  Interestingly, he is able to complete activity fairly well but when he is sitting watching television sometimes he feels as though he is having intermittent breathing difficulty.  LDL 67 hemoglobin 14.4 creatinine 1.2 potassium 4.8  Past Medical History:  Diagnosis Date  . Actinic keratosis 07/14/2012  . CAD (coronary artery disease)    Non obstructive (30-40% Circ, 40% RCA AM,2008)  . CHF (congestive heart failure) ([place])   . Depression   . Dyshidrotic eczema   . DYSHIDROTIC ECZEMA 10/20/2006   Qualifier: Diagnosis of  By: Lurlean Nanny LPN, Reg[place]    . ED (erectile dysfunction)   . Eustachian tube dysfunction 08/25/2017  . Gastritis and duodenitis   . GERD 10/20/2006   Qualifier: Diagnosis of  By: Lurlean Nanny LPN, Regina    . GERD (gastroesophageal reflux disease)    improved   . Heart failure    EF 40%, Echo 01-2009  . 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 OA (osteoarthritis)   . 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 Polyp of sigmoid colon   . Polyp of transverse colon   . PONV (postoperative nausea and vomiting)    past hx of PONV if under anesthesia a long period   . Renal disorder    kidney stones  . RESTLESS LEG SYNDROME 10/20/2006   Qualifier: Diagnosis of  By: Lurlean Nanny LPN, Regina    . Right shoulder pain 07/20/2019  . RLS (restless legs syndrome)   . Sleep disturbance    due to RLS    Past Surgical History:  Procedure Laterality Date  . CARPAL TUNNEL RELEASE Bilateral 05/2003   Right ([place])  . CATARACT EXTRACTION  10/2008   Left  . CATARACT EXTRACTION  11/2008   Right  . COLONOSCOPY    . COLONOSCOPY WITH PROPOFOL N/A 02/12/2018   Procedure: COLONOSCOPY WITH PROPOFOL;  Surgeon: Milus Banister, MD;  [facility];  Service: Endoscopy;  Laterality: N/A;  . ESOPHAGOGASTRIC FUNDOPLICATION  99991111   scope-[facility]  . KIDNEY STONE SURGERY  1997/2000  . LAPAROSCOPIC CHOLECYSTECTOMY  04/2009   Dr. Johney Maine  . POLYPECTOMY  02/12/2018   Procedure: POLYPECTOMY;  Surgeon: Milus Banister, MD;  Location: [facility];  Service: Endoscopy;;  . UPPER GASTROINTESTINAL ENDOSCOPY    . URETEROSCOPY  05/2008   [facility]    Current Medications: Current Meds  Medication Sig  . acetaminophen (TYLENOL 8 HOUR) 650 MG CR  tablet Take 1,300 mg by mouth 2 (two) times daily.   . clonazePAM (KLONOPIN) 0.5 MG tablet Take 1-2 tablets (0.5-1 mg total) by mouth at bedtime as needed for anxiety.  . metoprolol succinate (TOPROL-XL) 25 MG 24 hr tablet Take 1 tablet (25 mg total) by mouth daily.  . sacubitril-valsartan (ENTRESTO) 24-26 MG Take 1 tablet by mouth 2 (two) times daily.     Allergies:   Penicillins   Social History   Socioeconomic History  . Marital status: Married    Spouse name: Not on file  . Number of children: 3  . Years of education: Not on file  . Highest education level: Not on file  Occupational History  . Occupation: Retired    Comment: Airline pilot. Counsellor    . Occupation: PARK MANAGER    Employer: [employer]    Comment: [place] [employer] [employer]--part time  Tobacco Use  . Smoking status: Never Smoker  . Smokeless tobacco: Never Used  Substance and Sexual Activity  . Alcohol use: Yes    Comment: occasional  . Drug use: Never  . Sexual activity: Not on file  Other Topics Concern  . Not on file  Social History Narrative   Divorced then remarried 5/12      Has living will   Requests wife as his health care POA. No alternate at present   Would accept resuscitation attempts   Probably would accept a feeding tube--at least for some time   Social Determinants of Health   Financial Resource Strain:   . Difficulty of Paying Living Expenses:   Food Insecurity:   . Worried About Charity fundraiser in the Last Year:   . Arboriculturist in the Last Year:   Transportation Needs:   . Film/video editor (Medical):   Marland Kitchen Lack of Transportation (Non-Medical):   Physical Activity:   . Days of Exercise per Week:   . Minutes of Exercise per Session:   Stress:   . Feeling of Stress :   Social Connections:   . Frequency of Communication with Friends and Family:   . Frequency of Social Gatherings with Friends and Family:   . Attends Religious Services:   . Active Member of Clubs or Organizations:   . Attends Archivist Meetings:   Marland Kitchen Marital Status:      Family History: The patient's family history includes Arthritis in his sister; Cancer in his mother; Heart attack in his father; Heart disease in his father. There is no history of Colon cancer, Colon polyps, Esophageal cancer, Rectal cancer, or Stomach cancer.  ROS:   Please see the history of present illness.    No syncope no bleeding no orthopnea no PND all other systems reviewed and are negative.  EKGs/Labs/Other Studies Reviewed:    The following studies were reviewed today: Prior echo reviewed  EKG:  EKG is not ordered today.   Recent Labs: 06/16/2019: ALT 19;  BUN 16; Creatinine, Ser 1.22; Hemoglobin 14.4; Platelets 248.0; Potassium 4.8; Sodium 140  Recent Lipid Panel    Component Value Date/Time   CHOL 124 06/16/2019 1035   TRIG 79.0 06/16/2019 1035   HDL 41.50 06/16/2019 1035   CHOLHDL 3 06/16/2019 1035   VLDL 15.8 06/16/2019 1035   LDLCALC 67 06/16/2019 1035    Physical Exam:    VS:  BP 126/70   Pulse 62   Ht 5' 11.5" (1.816 m)   Wt 229 lb (103.9 kg)  SpO2 95%   BMI 31.49 kg/m     Wt Readings from Last 3 Encounters:  08/25/19 229 lb (103.9 kg)  07/28/19 231 lb (104.8 kg)  07/20/19 228 lb (103.4 kg)     GEN:  Well nourished, well developed in no acute distress HEENT: Normal NECK: No JVD; No carotid bruits LYMPHATICS: No lymphadenopathy CARDIAC: RRR, no murmurs, rubs, gallops RESPIRATORY:  Clear to auscultation without rales, wheezing or rhonchi  ABDOMEN: Soft, non-tender, non-distended MUSCULOSKELETAL:  No edema; No deformity  SKIN: Warm and dry NEUROLOGIC:  Alert and oriented x 3 PSYCHIATRIC:  Normal affect   ASSESSMENT:    1. Chronic systolic heart failure ([place])   2. Left bundle branch block   3. Aortic atherosclerosis ([place])   4. Essential hypertension, benign    PLAN:    In order of problems listed above:  Chronic systolic heart failure, nonischemic cardiomyopathy -EF has been reduced for several years, most recently 57 to 25%. -NYHA class II-like symptoms. -We initiated Entresto 24/26 at 07/28/2019 visit.  I will check basic metabolic profile today.  His blood pressure is 126/70.  I do not want to push this.  Cost may be an issue as well. -Also utilizing low-dose Toprol but he does have underlying left bundle branch block and heart rate of 61 bpm.  We would not be able to titrate this. -We will give him Lasix 20 mg once a day gentle dose to see if this helps him with his symptoms. -Overall I explained to him potential prognosis.  We discussed potentials for dangerous arrhythmias, sudden death.  Thankfully, he  is not very symptomatic with his cardiomyopathy.  Aortic atherosclerosis -Personally reviewed a CT scan of his abdomen.  Normally would start statin therapy but he has not been taking any medications up until now and I do not want to overload him with too many things.  I will see him back in 1 month after initiation of Lasix.  Medication Adjustments/Labs and Tests Ordered: Current medicines are reviewed at length with the patient today.  Concerns regarding medicines are outlined above.  Orders Placed This Encounter  Procedures  . Basic metabolic panel   Meds ordered this encounter  Medications  . furosemide (LASIX) 20 MG tablet    Sig: Take 1 tablet (20 mg total) by mouth daily.    Dispense:  90 tablet    Refill:  3    Patient Instructions  Medication Instructions:  Your physician has recommended you make the following change in your medication:  1) START Lasix (furosemide) 20 mg daily   *If you need a refill on your cardiac medications before your next appointment, please call your pharmacy*   Lab Work: TODAY: BMET If you have labs (blood work) drawn today and your tests are completely normal, you will receive your results only by: Marland Kitchen MyChart Message (if you have MyChart) OR . A paper copy in the mail If you have any lab test that is abnormal or we need to change your treatment, we will call you to review the results.   Follow-Up: At [facility], you and your health needs are our priority.  As part of our continuing mission to provide you with exceptional heart care, we have created designated Provider Care Teams.  These Care Teams include your primary Cardiologist (physician) and Advanced Practice Providers (APPs -  Physician Assistants and Nurse Practitioners) who all work together to provide you with the care you need, when you need it.  Your next appointment:   1 month(s)  The format for your next appointment:   In Person  Provider:   Candee Furbish, MD       Signed, Candee Furbish, MD  08/25/2019 10:12 AM    [place]

## 2019-08-30 ENCOUNTER — Telehealth: Payer: Self-pay | Admitting: Cardiology

## 2019-08-30 NOTE — Telephone Encounter (Signed)
I spoke with patient and reviewed recent BMP results with him  

## 2019-08-30 NOTE — Telephone Encounter (Signed)
Patient returning Pat's call in regards to lab results.

## 2019-09-27 ENCOUNTER — Encounter: Payer: Self-pay | Admitting: Cardiology

## 2019-09-27 ENCOUNTER — Ambulatory Visit (INDEPENDENT_AMBULATORY_CARE_PROVIDER_SITE_OTHER): Payer: Medicare Other | Admitting: Cardiology

## 2019-09-27 ENCOUNTER — Other Ambulatory Visit: Payer: Self-pay

## 2019-09-27 VITALS — BP 120/70 | HR 56 | Ht 72.0 in | Wt 231.0 lb

## 2019-09-27 DIAGNOSIS — N1831 Chronic kidney disease, stage 3a: Secondary | ICD-10-CM | POA: Diagnosis not present

## 2019-09-27 DIAGNOSIS — I5022 Chronic systolic (congestive) heart failure: Secondary | ICD-10-CM

## 2019-09-27 DIAGNOSIS — I700 Atherosclerosis of aorta: Secondary | ICD-10-CM | POA: Diagnosis not present

## 2019-09-27 DIAGNOSIS — I447 Left bundle-branch block, unspecified: Secondary | ICD-10-CM | POA: Diagnosis not present

## 2019-09-27 DIAGNOSIS — I255 Ischemic cardiomyopathy: Secondary | ICD-10-CM | POA: Diagnosis not present

## 2019-09-27 DIAGNOSIS — I420 Dilated cardiomyopathy: Secondary | ICD-10-CM | POA: Diagnosis not present

## 2019-09-27 NOTE — Progress Notes (Signed)
Cardiology Office Note:    Date:  09/27/2019   ID:  ALVEY CHUKWU, DOB 19-Feb-1941, MRN [id number]  PCP:  [name], MD  Cardiologist:  Candee Furbish, MD  Electrophysiologist:  None   Referring MD: [name], MD     History of Present Illness:    Caleb Chavez is a 79 y.o. male here for follow-up of cardiomyopathy.  Ejection fraction 20% in 2021 with mild nonobstructive coronary artery disease, 40% circumflex, 40% RCA 2008.  Nonischemic cardiomyopathy  Family history strong for several heart attacks, father, pacemaker.  Denies any palpitations syncope orthopnea PND.  Mild ankle edema at times.    Past Medical History:  Diagnosis Date  . Actinic keratosis 07/14/2012  . CAD (coronary artery disease)    Non obstructive (30-40% Circ, 40% RCA AM,2008)  . CHF (congestive heart failure) ([place])   . Depression   . Dyshidrotic eczema   . DYSHIDROTIC ECZEMA 10/20/2006   Qualifier: Diagnosis of  By: Lurlean Nanny LPN, [place]    . ED (erectile dysfunction)   . Eustachian tube dysfunction 08/25/2017  . Gastritis and duodenitis   . GERD 10/20/2006   Qualifier: Diagnosis of  By: Lurlean Nanny LPN, [place]    . GERD (gastroesophageal reflux disease)    improved   . Heart failure    EF 40%, Echo 01-2009  . Ischemic dilated cardiomyopathy ([place]) 01/25/2009   EF ~40% on echo 8/10   . OA (osteoarthritis)   . 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 Polyp of sigmoid colon   . Polyp of transverse colon   . PONV (postoperative nausea and vomiting)    past hx of PONV if under anesthesia a long period   . Renal disorder    kidney stones  . RESTLESS LEG SYNDROME 10/20/2006   Qualifier: Diagnosis of  By: Lurlean Nanny LPN, [place]    . Right shoulder pain 07/20/2019  . RLS (restless legs syndrome)   . Sleep disturbance    due to RLS    Past Surgical History:  Procedure Laterality Date  . CARPAL TUNNEL RELEASE Bilateral 05/2003   Right ([place])    . CATARACT EXTRACTION  10/2008   Left  . CATARACT EXTRACTION  11/2008   Right  . COLONOSCOPY    . COLONOSCOPY WITH PROPOFOL N/A 02/12/2018   Procedure: COLONOSCOPY WITH PROPOFOL;  Surgeon: Milus Banister, MD;  Location: [facility];  Service: Endoscopy;  Laterality: N/A;  . ESOPHAGOGASTRIC FUNDOPLICATION  99991111   scope-[facility]  . KIDNEY STONE SURGERY  1997/2000  . LAPAROSCOPIC CHOLECYSTECTOMY  04/2009   Dr. Johney [place]  . POLYPECTOMY  02/12/2018   Procedure: POLYPECTOMY;  Surgeon: Milus Banister, MD;  Location: [facility];  Service: Endoscopy;;  . UPPER GASTROINTESTINAL ENDOSCOPY    . URETEROSCOPY  05/2008   [facility]    Current Medications: Current Meds  Medication Sig  . acetaminophen (TYLENOL 8 HOUR) 650 MG CR tablet Take 1,300 mg by mouth 2 (two) times daily.   . clonazePAM (KLONOPIN) 0.5 MG tablet Take 1-2 tablets (0.5-1 mg total) by mouth at bedtime as needed for anxiety.  . furosemide (LASIX) 20 MG tablet Take 1 tablet (20 mg total) by mouth daily.  . metoprolol succinate (TOPROL-XL) 25 MG 24 hr tablet Take 1 tablet (25 mg total) by mouth daily.  . sacubitril-valsartan (ENTRESTO) 24-26 MG Take 1 tablet by mouth 2 (two) times daily.     Allergies:  Penicillins   Social History   Socioeconomic History  . Marital status: Married    Spouse name: Not on file  . Number of children: 3  . Years of education: Not on file  . Highest education level: Not on file  Occupational History  . Occupation: Retired    Comment: Airline pilot. Counsellor  . Occupation: PARK MANAGER    Employer: [employer]    Comment: [employer]--part time  Tobacco Use  . Smoking status: Never Smoker  . Smokeless tobacco: Never Used  Substance and Sexual Activity  . Alcohol use: Yes    Comment: occasional  . Drug use: Never  . Sexual activity: Not on file  Other Topics Concern  . Not on file  Social History Narrative   Divorced then remarried 5/12      Has living  will   Requests wife as his health care POA. No alternate at present   Would accept resuscitation attempts   Probably would accept a feeding tube--at least for some time   Social Determinants of Health   Financial Resource Strain:   . Difficulty of Paying Living Expenses:   Food Insecurity:   . Worried About Charity fundraiser in the Last Year:   . Arboriculturist in the Last Year:   Transportation Needs:   . Film/video editor (Medical):   Marland Kitchen Lack of Transportation (Non-Medical):   Physical Activity:   . Days of Exercise per Week:   . Minutes of Exercise per Session:   Stress:   . Feeling of Stress :   Social Connections:   . Frequency of Communication with Friends and Family:   . Frequency of Social Gatherings with Friends and Family:   . Attends Religious Services:   . Active Member of Clubs or Organizations:   . Attends Archivist Meetings:   Marland Kitchen Marital Status:      Family History: The patient's family history includes Arthritis in his sister; Cancer in his mother; Heart attack in his father; Heart disease in his father. There is no history of Colon cancer, Colon polyps, Esophageal cancer, Rectal cancer, or Stomach cancer.  ROS:   Please see the history of present illness.     All other systems reviewed and are negative.  EKGs/Labs/Other Studies Reviewed:    EKG:  EKG is not ordered today.    Recent Labs: 06/16/2019: ALT 19; Hemoglobin 14.4; Platelets 248.0 08/25/2019: BUN 17; Creatinine, Ser 1.43; Potassium 4.5; Sodium 137  Recent Lipid Panel    Component Value Date/Time   CHOL 124 06/16/2019 1035   TRIG 79.0 06/16/2019 1035   HDL 41.50 06/16/2019 1035   CHOLHDL 3 06/16/2019 1035   VLDL 15.8 06/16/2019 1035   LDLCALC 67 06/16/2019 1035    Physical Exam:    VS:  BP 120/70   Pulse (!) 56   Ht 6' (1.829 m)   Wt 231 lb (104.8 kg)   SpO2 98%   BMI 31.33 kg/m     Wt Readings from Last 3 Encounters:  09/27/19 231 lb (104.8 kg)  08/25/19 229 lb  (103.9 kg)  07/28/19 231 lb (104.8 kg)     GEN:  Well nourished, well developed in no acute distress HEENT: Normal NECK: No JVD; No carotid bruits LYMPHATICS: No lymphadenopathy CARDIAC: RRR, no murmurs, rubs, gallops RESPIRATORY:  Clear to auscultation without rales, wheezing or rhonchi  ABDOMEN: Soft, non-tender, non-distended MUSCULOSKELETAL:  No edema; No deformity  SKIN: Warm and dry NEUROLOGIC:  Alert and oriented x 3 PSYCHIATRIC:  Normal affect   ASSESSMENT:    1. Chronic systolic heart failure ([place])   2. Left bundle branch block   3. Aortic atherosclerosis (HCC)   4. Stage 3a chronic kidney disease    PLAN:    In order of problems listed above:  Chronic systolic heart failure, nonischemic cardiomyopathy -EF has been reduced for several years, most recently in the 29 to 25% range with class II NYHA symptoms. -We initiated Entresto 24/26 in February 2021.  Pressures are excellent. Feels better since starting. Breathing is better. Tired mildly still, little dizzy, but not often. 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  Be careful. -Lasix 20 mg has been utilized as well. -Creatinine did increase to 1.43 from 1.22.  Potassium 4.5. Overall I am pleased with how he is feeling, he is also.  He is feeling much better.  Aortic atherosclerosis -Seen on prior CT of his abdomen personally reviewed.  Chronic kidney disease stage III -Continue to monitor creatinine.  We will recheck at next visit if not already done previously.  As I said above, I did expect a slight increase in his creatinine with his current medication strategy.  Medication Adjustments/Labs and Tests Ordered: Current medicines are reviewed at length with the patient today.  Concerns regarding medicines are  outlined above.  No orders of the defined types were placed in this encounter.  No orders of the defined types were placed in this encounter.   Patient Instructions  Medication Instructions:  The current medical regimen is effective;  continue present plan and medications.  *If you need a refill on your cardiac medications before your next appointment, please call your pharmacy*  Follow-Up: At [facility], you and your health needs are our priority.  As part of our continuing mission to provide you with exceptional heart care, we have created designated Provider Care Teams.  These Care Teams include your primary Cardiologist (physician) and Advanced Practice Providers (APPs -  Physician Assistants and Nurse Practitioners) who all work together to provide you with the care you need, when you need it.  We recommend signing up for the patient portal called "MyChart".  Sign up information is provided on this After Visit Summary.  MyChart is used to connect with patients for Virtual Visits (Telemedicine).  Patients are able to view lab/test results, encounter notes, upcoming appointments, etc.  Non-urgent messages can be sent to your provider as well.   To learn more about what you can do with MyChart, go to [email or web address].    Your next appointment:   6 month(s)  The format for your next appointment:   In Person  Provider:   Candee Furbish, MD  Thank you for choosing [facility]!!         Signed, Candee Furbish, MD  09/27/2019 9:21 AM    [place]

## 2019-09-27 NOTE — Patient Instructions (Signed)

## 2020-03-03 ENCOUNTER — Encounter: Payer: Self-pay | Admitting: Internal Medicine

## 2020-04-08 DIAGNOSIS — Z23 Encounter for immunization: Secondary | ICD-10-CM | POA: Diagnosis not present

## 2020-04-28 DIAGNOSIS — Z23 Encounter for immunization: Secondary | ICD-10-CM | POA: Diagnosis not present

## 2020-06-19 ENCOUNTER — Ambulatory Visit: Payer: Medicare Other | Admitting: Internal Medicine

## 2020-10-10 ENCOUNTER — Telehealth: Payer: Self-pay | Admitting: Cardiology

## 2020-10-10 NOTE — Telephone Encounter (Signed)
OK to refill Entresto, but let's encourage him to come into be seen by me or APP. May need increased dose if BP is high.   Thanks  Candee Furbish, MD

## 2020-10-10 NOTE — Telephone Encounter (Signed)
Pt last seen here April 2021.  Was due for f/u in 6 mns but has not returned to clinic to be seen.

## 2020-10-10 NOTE — Telephone Encounter (Signed)
Pt c/o medication issue:  1. Name of [place] (ENTRESTO) 24-26 MG  2. How are you currently taking this medication (dosage and times per day)? 1 tablet twice a day  3. Are you having a reaction (difficulty breathing--STAT)? no  4. What is your medication issue? Dr. Ishmael Holter from the [facility] states the patient wants them to refill his entresto. She states they need the prescription and office notes confirming he takes the medication. Fax: [phone], JQGBE:[phone] ask for [facility]. She also states the patient has had an elevated BP and wanted to make Dr. Marlou Porch aware. She states the patient is monitoring it and will call them back.

## 2020-10-11 MED ORDER — SACUBITRIL-VALSARTAN 24-26 MG PO TABS: 1.0000 | ORAL_TABLET | Freq: Two times a day (BID) | ORAL | 1 refills | Status: DC

## 2020-10-11 NOTE — Telephone Encounter (Signed)
Delene Loll RX completed and signed by Dr Marlou Porch.   Inquired of HIM if a release of information is needed and was advised to ask Lillia Pauls b/c they couldn't say.  Reviewed with Crystal would advised it will be fine to fax the information to Dr Ishmael Holter as she is activielt involved in the pt's care. Called and left message for pt he is overdue to be seen and needs to call back to schedule a follow up appt with Dr Marlou Porch.

## 2021-04-03 ENCOUNTER — Encounter: Payer: Self-pay | Admitting: Cardiology

## 2021-04-03 ENCOUNTER — Ambulatory Visit (INDEPENDENT_AMBULATORY_CARE_PROVIDER_SITE_OTHER): Payer: Medicare HMO | Admitting: Cardiology

## 2021-04-03 ENCOUNTER — Other Ambulatory Visit: Payer: Self-pay

## 2021-04-03 VITALS — BP 150/80 | HR 65 | Ht 72.0 in | Wt 226.0 lb

## 2021-04-03 DIAGNOSIS — I10 Essential (primary) hypertension: Secondary | ICD-10-CM

## 2021-04-03 DIAGNOSIS — I5022 Chronic systolic (congestive) heart failure: Secondary | ICD-10-CM | POA: Diagnosis not present

## 2021-04-03 DIAGNOSIS — I700 Atherosclerosis of aorta: Secondary | ICD-10-CM | POA: Diagnosis not present

## 2021-04-03 DIAGNOSIS — N1831 Chronic kidney disease, stage 3a: Secondary | ICD-10-CM | POA: Diagnosis not present

## 2021-04-03 DIAGNOSIS — Z79899 Other long term (current) drug therapy: Secondary | ICD-10-CM

## 2021-04-03 DIAGNOSIS — I447 Left bundle-branch block, unspecified: Secondary | ICD-10-CM | POA: Diagnosis not present

## 2021-04-03 DIAGNOSIS — I25119 Atherosclerotic heart disease of native coronary artery with unspecified angina pectoris: Secondary | ICD-10-CM

## 2021-04-03 LAB — LIPID PANEL
Chol/HDL Ratio: 3.1 ratio (ref 0.0–5.0)
Cholesterol, Total: 127 mg/dL (ref 100–199)
HDL: 41 mg/dL (ref >39)
LDL Chol Calc (NIH): 74 mg/dL (ref 0–99)
Triglycerides: 55 mg/dL (ref 0–149)
VLDL Cholesterol Cal: 12 mg/dL (ref 5–40)

## 2021-04-03 LAB — COMPREHENSIVE METABOLIC PANEL
ALT: 10 IU/L (ref 0–44)
AST: 11 IU/L (ref 0–40)
Albumin/Globulin Ratio: 1.7 (ref 1.2–2.2)
Albumin: 4.5 g/dL (ref 3.6–4.6)
Alkaline Phosphatase: 79 IU/L (ref 44–121)
BUN/Creatinine Ratio: 14 (ref 10–24)
BUN: 20 mg/dL (ref 8–27)
Bilirubin Total: 1.2 mg/dL (ref 0.0–1.2)
CO2: 23 mmol/L (ref 20–29)
Calcium: 9.7 mg/dL (ref 8.6–10.2)
Chloride: 104 mmol/L (ref 96–106)
Creatinine, Ser: 1.4 mg/dL — ABNORMAL HIGH (ref 0.76–1.27)
Globulin, Total: 2.7 g/dL (ref 1.5–4.5)
Glucose: 125 mg/dL — ABNORMAL HIGH (ref 70–99)
Potassium: 5 mmol/L (ref 3.5–5.2)
Sodium: 141 mmol/L (ref 134–144)
Total Protein: 7.2 g/dL (ref 6.0–8.5)
eGFR: 50 mL/min/{1.73_m2} — ABNORMAL LOW (ref >59)

## 2021-04-03 LAB — CBC
Hematocrit: 42.8 % (ref 37.5–51.0)
Hemoglobin: 14.4 g/dL (ref 13.0–17.7)
MCH: 29.9 pg (ref 26.6–33.0)
MCHC: 33.6 g/dL (ref 31.5–35.7)
MCV: 89 fL (ref 79–97)
Platelets: 273 10*3/uL (ref 150–450)
RBC: 4.82 x10E6/uL (ref 4.14–5.80)
RDW: 12.1 % (ref 11.6–15.4)
WBC: 8.8 10*3/uL (ref 3.4–10.8)

## 2021-04-03 MED ORDER — SACUBITRIL-VALSARTAN 24-26 MG PO TABS: 1.0000 | ORAL_TABLET | Freq: Two times a day (BID) | ORAL | 2 refills | Status: AC

## 2021-04-03 MED ORDER — METOPROLOL SUCCINATE ER 25 MG PO TB24: 25.0000 mg | ORAL_TABLET | Freq: Every day | ORAL | 3 refills | Status: AC

## 2021-04-03 MED ORDER — ROSUVASTATIN CALCIUM 20 MG PO TABS: 20.0000 mg | ORAL_TABLET | Freq: Every day | ORAL | 2 refills | Status: DC

## 2021-04-03 NOTE — Assessment & Plan Note (Signed)
Stable, no changes.  No syncope.  Be careful with Toprol.

## 2021-04-03 NOTE — Assessment & Plan Note (Signed)
Checking lipid profile.  With nonobstructive disease, would like him to be on Crestor.  We will start 20 mg a day.  Watch for any myalgias.  Checking liver function.

## 2021-04-03 NOTE — Patient Instructions (Signed)
Medication Instructions:   START TAKING ROSUVASTATIN (CRESTOR) 20 MG BY MOUTH DAILY  *If you need a refill on your cardiac medications before your next appointment, please call your pharmacy*   Lab Work:  TODAY--CMET, CBC, AND LIPIDS  If you have labs (blood work) drawn today and your tests are completely normal, you will receive your results only by: [place] (if you have MyChart) OR A paper copy in the mail If you have any lab test that is abnormal or we need to change your treatment, we will call you to review the results.   Follow-Up: At [facility], you and your health needs are our priority.  As part of our continuing mission to provide you with exceptional heart care, we have created designated Provider Care Teams.  These Care Teams include your primary Cardiologist (physician) and Advanced Practice Providers (APPs -  Physician Assistants and Nurse Practitioners) who all work together to provide you with the care you need, when you need it.  We recommend signing up for the patient portal called "MyChart".  Sign up information is provided on this After Visit Summary.  MyChart is used to connect with patients for Virtual Visits (Telemedicine).  Patients are able to view lab/test results, encounter notes, upcoming appointments, etc.  Non-urgent messages can be sent to your provider as well.   To learn more about what you can do with MyChart, go to [email or web address].    Your next appointment:   6 month(s)  The format for your next appointment:   In Person  Provider:   You will see one of the following Advanced Practice Providers on your designated Care Team:   Cecilie Kicks, NP SCOTT WEAVER PA-C DAYNA DUNN PA-C VIN BHAGAT PA-C

## 2021-04-03 NOTE — Assessment & Plan Note (Signed)
Nonobstructive CAD 30 to 40% circumflex 40% RCA in 2008.  Making sure that he is on Crestor for stabilization.

## 2021-04-03 NOTE — Assessment & Plan Note (Signed)
20 to 25% ejection fraction with class II heart failure symptoms over several years. Back in February 2021 initiated Entresto 24/26.  He is getting this through the [facility].  Unfortunately over the last few months he has been out of this medication.  Waiting for this appointment.  Doing well from a blood pressure perspective with the Entresto.  It is a little bit high today.  Also takes low-dose Toprol and we will continue this albeit carefully with his left bundle branch block.  Continue with low-dose Lasix as well 20 mg.  We will check blood work as well today.  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

## 2021-04-03 NOTE — Progress Notes (Signed)
Cardiology Office Note:    Date:  04/03/2021   ID:  Caleb Chavez, DOB 08/15/1940, MRN [id number]  PCP:  [name], MD  Cardiologist:  Candee Furbish, MD  Electrophysiologist:  None   Referring MD: [name], MD     History of Present Illness:    Caleb Chavez is a 80 y.o. male here for follow-up cardiomyopathy.  For the last 8 months he has not been taking his metoprolol or Entresto, states that he could not get refills since he did not have an appointment here.  Ejection fraction 20% in 2021 with mild nonobstructive coronary artery disease, 40% circumflex, 40% RCA 2008.  Nonischemic cardiomyopathy  Family history strong for several heart attacks, father, pacemaker.  Left lower extremity edema. No chest pain no shortness of breath no fever chills.    Past Medical History:  Diagnosis Date   Actinic keratosis 07/14/2012   CAD (coronary artery disease)    Non obstructive (30-40% Circ, 40% RCA AM,2008)   CHF (congestive heart failure) ([place])    Depression    Dyshidrotic eczema    DYSHIDROTIC ECZEMA 10/20/2006   Qualifier: Diagnosis of  By: Lurlean Nanny LPN, [facility]     ED (erectile dysfunction)    Eustachian tube dysfunction 08/25/2017   Gastritis and duodenitis    GERD 10/20/2006   Qualifier: Diagnosis of  By: Lurlean Nanny LPN, [place]     GERD (gastroesophageal reflux disease)    improved    Heart failure    EF 40%, Echo 01-2009   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   EF ~40% on echo 8/10    OA (osteoarthritis)    Osteoarthritis, 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/07/2007   Qualifier: Diagnosis of  By: Silvio Pate MD, [facility]    Polyp of sigmoid colon    Polyp of transverse colon    PONV (postoperative nausea and vomiting)    past hx of PONV if under anesthesia a long period    Renal disorder    kidney stones   RESTLESS LEG SYNDROME 10/20/2006   Qualifier: Diagnosis of  By: Lurlean Nanny LPN, [place]     Right shoulder pain 07/20/2019   RLS (restless legs syndrome)    Sleep disturbance     due to RLS    Past Surgical History:  Procedure Laterality Date   CARPAL TUNNEL RELEASE Bilateral 05/2003   Right ([place])   CATARACT EXTRACTION  10/2008   Left   CATARACT EXTRACTION  11/2008   Right   COLONOSCOPY     COLONOSCOPY WITH PROPOFOL N/A 02/12/2018   Procedure: COLONOSCOPY WITH PROPOFOL;  Surgeon: Milus Banister, MD;  [facility]: [facility];  Service: Endoscopy;  Laterality: N/A;   ESOPHAGOGASTRIC FUNDOPLICATION  4098   scope-[facility]   KIDNEY STONE SURGERY  1997/2000   LAPAROSCOPIC CHOLECYSTECTOMY  04/2009   Dr. Johney [place]   POLYPECTOMY  02/12/2018   Procedure: POLYPECTOMY;  Surgeon: Milus Banister, MD;  [facility];  Service: Endoscopy;;   UPPER GASTROINTESTINAL ENDOSCOPY     URETEROSCOPY  05/2008   [facility]    Current Medications: Current Meds  Medication Sig   acetaminophen (TYLENOL) 650 MG CR tablet Take 1,300 mg by mouth 2 (two) times daily.   furosemide (LASIX) 20 MG tablet Take 1 tablet (20 mg total) by mouth daily.   rosuvastatin (CRESTOR) 20 MG tablet Take 1 tablet (20 mg total) by mouth daily.   [DISCONTINUED] metoprolol succinate (TOPROL-XL) 25 MG 24 hr tablet Take 1 tablet (25 mg total) by mouth  daily.   [DISCONTINUED] sacubitril-valsartan (ENTRESTO) 24-26 MG Take 1 tablet by mouth 2 (two) times daily.     Allergies:   Penicillins   Social History   Socioeconomic History   Marital status: Married    Spouse name: Not on file   Number of children: 3   Years of education: Not on file   Highest education level: Not on file  Occupational History   Occupation: Retired    Comment: Airline pilot. dispatcher   Occupation: PARK MANAGER    Employer: [employer]: [employer]--part time  Tobacco Use   Smoking status: Never   Smokeless tobacco: Never  Vaping Use   Vaping Use: Never used  Substance and Sexual Activity   Alcohol use: Yes    Comment: occasional   Drug use: Never   Sexual activity:  Not on file  Other Topics Concern   Not on file  Social History Narrative   Divorced then remarried 5/12      Has living will   Requests wife as his health care POA. No alternate at present   Would accept resuscitation attempts   Probably would accept a feeding tube--at least for some time   Social Determinants of Health   Financial Resource Strain: Not on file  Food Insecurity: Not on file  Transportation Needs: Not on file  Physical Activity: Not on file  Stress: Not on file  Social Connections: Not on file     Family History: The patient's family history includes Arthritis in his sister; Cancer in his mother; Heart attack in his father; Heart disease in his father. There is no history of Colon cancer, Colon polyps, Esophageal cancer, Rectal cancer, or Stomach cancer.  ROS:   Please see the history of present illness.     All other systems reviewed and are negative.  EKGs/Labs/Other Studies Reviewed:    EKG: 65 sinus rhythm left bundle branch block  Recent Labs: No results found for requested labs within last 8760 hours.  Recent Lipid Panel    Component Value Date/Time   CHOL 124 06/16/2019 1035   TRIG 79.0 06/16/2019 1035   HDL 41.50 06/16/2019 1035   CHOLHDL 3 06/16/2019 1035   VLDL 15.8 06/16/2019 1035   LDLCALC 67 06/16/2019 1035    Physical Exam:    VS:  BP (!) 150/80 (BP Location: Left Arm, Patient Position: Sitting, Cuff Size: Normal)   Pulse 65   Ht 6' (1.829 m)   Wt 226 lb (102.5 kg)   SpO2 97%   BMI 30.65 kg/m     Wt Readings from Last 3 Encounters:  04/03/21 226 lb (102.5 kg)  09/27/19 231 lb (104.8 kg)  08/25/19 229 lb (103.9 kg)     GEN:  Well nourished, well developed in no acute distress HEENT: Normal NECK: No JVD; No carotid bruits LYMPHATICS: No lymphadenopathy CARDIAC: Regular rate and rhythm, no murmurs, rubs, gallops RESPIRATORY:  Clear to auscultation without rales, wheezing or rhonchi  ABDOMEN: Soft, non-tender,  non-distended MUSCULOSKELETAL:  No edema; No deformity  SKIN: Warm and dry NEUROLOGIC:  Alert and oriented x 3 PSYCHIATRIC:  Normal affect   ASSESSMENT:    1. Chronic systolic heart failure ([place])   2. Essential hypertension, benign   3. Left bundle branch block   4. Aortic atherosclerosis ([place])   5. Medication management   6. Stage 3a chronic kidney disease ([place])   7. Coronary artery disease involving native heart with  angina pectoris, unspecified vessel or lesion type ([place])     PLAN:    In order of problems listed above:  Chronic systolic heart failure ([place]) 20 to 25% ejection fraction with class II heart failure symptoms over several years. Back in February 2021 initiated Entresto 24/26.  He is getting this through the [facility].  Unfortunately over the last few months he has been out of this medication.  Waiting for this appointment.  Doing well from a blood pressure perspective with the Entresto.  It is a little bit high today.  Also takes low-dose Toprol and we will continue this albeit carefully with his left bundle branch block.  Continue with low-dose Lasix as well 20 mg.  We will check blood work as well today.  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  Left bundle branch block Stable, no changes.  No syncope.  Be careful with Toprol.  Aortic atherosclerosis (HCC) Checking lipid profile.  With nonobstructive disease, would like him to be on Crestor.  We will start 20 mg a day.  Watch for any myalgias.  Checking liver function.  Coronary artery disease involving native coronary artery of native heart without angina pectoris Nonobstructive CAD 30 to 40% circumflex 40% RCA in 2008.  Making sure that he is on Crestor for stabilization.    Medication Adjustments/Labs and Tests Ordered: Current medicines are reviewed at length with the patient today.  Concerns regarding medicines are outlined above.  Orders Placed This Encounter  Procedures   Comp Met (CMET)    CBC   Lipid Profile   EKG 12-Lead    Meds ordered this encounter  Medications   sacubitril-valsartan (ENTRESTO) 24-26 MG    Sig: Take 1 tablet by mouth 2 (two) times daily.    Dispense:  180 tablet    Refill:  2   metoprolol succinate (TOPROL-XL) 25 MG 24 hr tablet    Sig: Take 1 tablet (25 mg total) by mouth daily.    Dispense:  90 tablet    Refill:  3   rosuvastatin (CRESTOR) 20 MG tablet    Sig: Take 1 tablet (20 mg total) by mouth daily.    Dispense:  90 tablet    Refill:  2     Patient Instructions  Medication Instructions:   START TAKING ROSUVASTATIN (CRESTOR) 20 MG BY MOUTH DAILY  *If you need a refill on your cardiac medications before your next appointment, please call your pharmacy*   Lab Work:  TODAY--CMET, CBC, AND LIPIDS  If you have labs (blood work) drawn today and your tests are completely normal, you will receive your results only by: [place] (if you have MyChart) OR A paper copy in the mail If you have any lab test that is abnormal or we need to change your treatment, we will call you to review the results.   Follow-Up: At [facility], you and your health needs are our priority.  As part of our continuing mission to provide you with exceptional heart care, we have created designated Provider Care Teams.  These Care Teams include your primary Cardiologist (physician) and Advanced Practice Providers (APPs -  Physician Assistants and Nurse Practitioners) who all work together to provide you with the care you need, when you need it.  We recommend signing up for the patient portal called "MyChart".  Sign up information is provided on this After Visit Summary.  MyChart is used to connect with patients for Virtual Visits (Telemedicine).  Patients  are able to view lab/test results, encounter notes, upcoming appointments, etc.  Non-urgent messages can be sent to your provider as well.   To learn more about what you can do with MyChart, go to  [email or web address].    Your next appointment:   6 month(s)  The format for your next appointment:   In Person  Provider:   You will see one of the following Advanced Practice Providers on your designated Care Team:   Cecilie Kicks, NP [address] PA-C DAYNA DUNN PA-C VIN [place] PA-C       Signed, Candee Furbish, MD  04/03/2021 11:06 AM    [place]

## 2021-04-05 ENCOUNTER — Telehealth: Payer: Self-pay | Admitting: Cardiology

## 2021-04-05 NOTE — Telephone Encounter (Signed)
*  STAT* If patient is at the pharmacy, call can be transferred to refill team.   1. Which medications need to be refilled? (please list name of each medication and dose if known) rosuvastatin (CRESTOR) 20 MG tablet  2. Which pharmacy/location (including street and city if local pharmacy) is medication to be sent to? [facility] [address]   3. Do they need a 30 day or 90 day supply? 90 day

## 2021-04-05 NOTE — Telephone Encounter (Signed)
Patient's wife is calling about lab results.

## 2021-04-05 NOTE — Telephone Encounter (Signed)
Called and spoke with pt's wife after verifying his DOB.  Reviewed pt's lab results and to continue current medications.  All questions were answered at the time of the call.

## 2021-04-06 ENCOUNTER — Other Ambulatory Visit: Payer: Self-pay | Admitting: *Deleted

## 2021-06-11 DIAGNOSIS — Z20822 Contact with and (suspected) exposure to covid-19: Secondary | ICD-10-CM | POA: Diagnosis not present

## 2021-06-12 ENCOUNTER — Other Ambulatory Visit: Payer: Self-pay | Admitting: *Deleted

## 2021-06-12 ENCOUNTER — Encounter: Payer: Self-pay | Admitting: Cardiology

## 2021-06-12 ENCOUNTER — Telehealth: Payer: Self-pay | Admitting: *Deleted

## 2021-06-12 ENCOUNTER — Encounter: Payer: Self-pay | Admitting: Internal Medicine

## 2021-06-12 MED ORDER — ROSUVASTATIN CALCIUM 20 MG PO TABS: 20.0000 mg | ORAL_TABLET | Freq: Every day | ORAL | 2 refills | Status: AC

## 2021-06-12 NOTE — Telephone Encounter (Signed)
Error

## 2021-06-13 NOTE — Telephone Encounter (Signed)
LMTCB to schedule.

## 2021-07-01 ENCOUNTER — Emergency Department (HOSPITAL_COMMUNITY)
Admission: EM | Admit: 2021-07-01 | Discharge: 2021-07-02 | Disposition: A | Discharge status: Home / Self Care | Payer: Medicare HMO | Attending: Emergency Medicine | Admitting: Emergency Medicine

## 2021-07-01 ENCOUNTER — Emergency Department (HOSPITAL_COMMUNITY): Payer: Medicare HMO

## 2021-07-01 ENCOUNTER — Encounter (HOSPITAL_COMMUNITY): Payer: Self-pay

## 2021-07-01 DIAGNOSIS — T467X5A Adverse effect of peripheral vasodilators, initial encounter: Secondary | ICD-10-CM | POA: Diagnosis not present

## 2021-07-01 DIAGNOSIS — L299 Pruritus, unspecified: Secondary | ICD-10-CM | POA: Insufficient documentation

## 2021-07-01 DIAGNOSIS — T7840XA Allergy, unspecified, initial encounter: Secondary | ICD-10-CM | POA: Diagnosis not present

## 2021-07-01 DIAGNOSIS — Z79899 Other long term (current) drug therapy: Secondary | ICD-10-CM | POA: Insufficient documentation

## 2021-07-01 DIAGNOSIS — I2510 Atherosclerotic heart disease of native coronary artery without angina pectoris: Secondary | ICD-10-CM | POA: Insufficient documentation

## 2021-07-01 DIAGNOSIS — R0602 Shortness of breath: Secondary | ICD-10-CM | POA: Insufficient documentation

## 2021-07-01 DIAGNOSIS — R110 Nausea: Secondary | ICD-10-CM | POA: Insufficient documentation

## 2021-07-01 DIAGNOSIS — I509 Heart failure, unspecified: Secondary | ICD-10-CM | POA: Diagnosis not present

## 2021-07-01 DIAGNOSIS — R0600 Dyspnea, unspecified: Secondary | ICD-10-CM | POA: Diagnosis not present

## 2021-07-01 LAB — CBC WITH DIFFERENTIAL/PLATELET
Abs Immature Granulocytes: 0.05 10*3/uL (ref 0.00–0.07)
Basophils Absolute: 0.1 10*3/uL (ref 0.0–0.1)
Basophils Relative: 0 %
Eosinophils Absolute: 0.1 10*3/uL (ref 0.0–0.5)
Eosinophils Relative: 0 %
HCT: 46.2 % (ref 39.0–52.0)
Hemoglobin: 15.1 g/dL (ref 13.0–17.0)
Immature Granulocytes: 0 %
Lymphocytes Relative: 26 %
Lymphs Abs: 3.5 10*3/uL (ref 0.7–4.0)
MCH: 30.2 pg (ref 26.0–34.0)
MCHC: 32.7 g/dL (ref 30.0–36.0)
MCV: 92.4 fL (ref 80.0–100.0)
Monocytes Absolute: 0.8 10*3/uL (ref 0.1–1.0)
Monocytes Relative: 6 %
Neutro Abs: 9.2 10*3/uL — ABNORMAL HIGH (ref 1.7–7.7)
Neutrophils Relative %: 68 %
Platelets: 252 10*3/uL (ref 150–400)
RBC: 5 MIL/uL (ref 4.22–5.81)
RDW: 13.8 % (ref 11.5–15.5)
WBC: 13.7 10*3/uL — ABNORMAL HIGH (ref 4.0–10.5)
nRBC: 0 % (ref 0.0–0.2)

## 2021-07-01 LAB — BASIC METABOLIC PANEL
Anion gap: 13 (ref 5–15)
BUN: 26 mg/dL — ABNORMAL HIGH (ref 8–23)
CO2: 21 mmol/L — ABNORMAL LOW (ref 22–32)
Calcium: 9.2 mg/dL (ref 8.9–10.3)
Chloride: 107 mmol/L (ref 98–111)
Creatinine, Ser: 1.56 mg/dL — ABNORMAL HIGH (ref 0.61–1.24)
GFR, Estimated: 44 mL/min — ABNORMAL LOW (ref >60)
Glucose, Bld: 222 mg/dL — ABNORMAL HIGH (ref 70–99)
Potassium: 4.3 mmol/L (ref 3.5–5.1)
Sodium: 141 mmol/L (ref 135–145)

## 2021-07-01 LAB — BRAIN NATRIURETIC PEPTIDE: B Natriuretic Peptide: 176.4 pg/mL — ABNORMAL HIGH (ref 0.0–100.0)

## 2021-07-01 NOTE — ED Provider Triage Note (Signed)
Emergency Medicine Provider Triage Evaluation Note  Caleb Chavez , a 81 y.o. male  was evaluated in triage.  Pt complains of allergic reaction onset tonight.  He notes that he took a new vitamin tonight (niacin) prior to the onset of his symptoms.  He then developed itching/burning of skin with some shortness of breath.  Denies history of asthma or COPD.  Patient has had his symptoms have since improved.  Has not tried medications for his symptoms.  Denies fever, chills, nausea, vomiting, chest pain.  Review of Systems  Positive: As per HPI above. Negative: Fever, chills  Physical Exam  BP 132/71 (BP Location: Left Arm)    Pulse 86    Temp (!) 97.5 F (36.4 C)    Resp 18    Ht 6' (1.829 m)    Wt 102 kg    SpO2 98%    BMI 30.50 kg/m  Gen:   Awake, no distress   Resp:  Normal effort  MSK:   Moves extremities without difficulty  Other:  Patent airway.  No evidence of airway compromise.  No chest wall tenderness palpation.  No rash noted.  Mucous membranes without edema.  Medical Decision Making  Medically screening exam initiated at 10:01 PM.  Appropriate orders placed.  Husein R Gayden was informed that the remainder of the evaluation will be completed by another provider, this initial triage assessment does not replace that evaluation, and the importance of remaining in the ED until their evaluation is complete.    Prayan Ulin A, PA-C 07/01/21 2207

## 2021-07-01 NOTE — ED Triage Notes (Addendum)
Pt reports that he took his normal nighttime medications this evening. Pt reports there was one new vitamin (Niacin) that he took this evening for the first time. States shortly after he developed itching/burning of skin and some minor SOB. Pt reports these symptoms have improved. Pt w/ no resp distress in triage. Oropharynx is clear and patent

## 2021-07-02 NOTE — ED Provider Notes (Addendum)
[facility] EMERGENCY DEPARTMENT Provider Note   CSN: [id number] Arrival date & time: 07/01/21  2135     History  Chief Complaint  Patient presents with   Allergic Reaction    Geordan Xu Rossman is a 81 y.o. male.   Allergic Reaction  81 year old male with past medical history significant for depression, restless leg syndrome, osteoarthritis, gastritis and duodenitis, CHF, CAD, GERD who presents to the emergency department after an adverse reaction to medication.  The patient states that he recently started taking niacin as an OTC supplement.  He took a 500 mg tablet earlier tonight and shortly thereafter developed sudden onset flushing, itching/burning of the skin and shortness of breath with mild nausea.  Symptoms lasted for around 4 hours and have since resolved.  He has been in the emergency department for over 10 hours without recurrence of symptoms.  He has not taken the niacin in that dose before.  Home Medications Prior to Admission medications   Medication Sig Start Date End Date Taking? Authorizing Provider  acetaminophen (TYLENOL) 650 MG CR tablet Take 1,300 mg by mouth 2 (two) times daily.    [provider]  furosemide (LASIX) 20 MG tablet Take 1 tablet (20 mg total) by mouth daily. 08/25/19   Jerline Pain, MD  metoprolol succinate (TOPROL-XL) 25 MG 24 hr tablet Take 1 tablet (25 mg total) by mouth daily. 04/03/21   Jerline Pain, MD  rosuvastatin (CRESTOR) 20 MG tablet Take 1 tablet (20 mg total) by mouth daily. 06/12/21   Jerline Pain, MD  sacubitril-valsartan (ENTRESTO) 24-26 MG Take 1 tablet by mouth 2 (two) times daily. 04/03/21   Jerline Pain, MD      Allergies    Penicillins    Review of Systems   Review of Systems  All other systems reviewed and are negative.  Physical Exam Updated Vital Signs BP 122/73    Pulse 67    Temp (!) 97.5 F (36.4 C)    Resp 17    Ht 6' (1.829 m)    Wt 102 kg    SpO2 97%    BMI 30.50 kg/m  Physical  Exam Vitals and nursing note reviewed.  Constitutional:      General: He is not in acute distress. HENT:     Head: Normocephalic and atraumatic.  Eyes:     Conjunctiva/sclera: Conjunctivae normal.     Pupils: Pupils are equal, round, and reactive to light.  Cardiovascular:     Rate and Rhythm: Normal rate and regular rhythm.  Pulmonary:     Effort: Pulmonary effort is normal. No respiratory distress.  Abdominal:     General: There is no distension.     Tenderness: There is no guarding.  Musculoskeletal:        General: No deformity or signs of injury.     Cervical back: Neck supple.  Skin:    Findings: No lesion or rash.  Neurological:     General: No focal deficit present.     Mental Status: He is alert. Mental status is at baseline.    ED Results / Procedures / Treatments   Labs (all labs ordered are listed, but only abnormal results are displayed) Labs Reviewed  BASIC METABOLIC PANEL - Abnormal; Notable for the following components:      Result Value   CO2 21 (*)    Glucose, Bld 222 (*)    BUN 26 (*)    Creatinine, Ser 1.56 (*)  GFR, Estimated 44 (*)    All other components within normal limits  CBC WITH DIFFERENTIAL/PLATELET - Abnormal; Notable for the following components:   WBC 13.7 (*)    Neutro Abs 9.2 (*)    All other components within normal limits  BRAIN NATRIURETIC PEPTIDE - Abnormal; Notable for the following components:   B Natriuretic Peptide 176.4 (*)    All other components within normal limits    EKG EKG Interpretation  Date/Time:  Sunday July 01 2021 21:50:20 EST Ventricular Rate:  85 PR Interval:  206 QRS Duration: 180 QT Interval:  434 QTC Calculation: 516 R Axis:   -64 Text Interpretation: Sinus rhythm with Premature ventricular complexes or Fusion complexes Left axis deviation Left bundle branch block Abnormal ECG When compared with ECG of 18-Jan-2009 06:53, Premature ventricular complexes are now present Confirmed by Delora Fuel  ([id number]) on 07/02/2021 12:18:03 AM  Radiology DG Chest 2 View  Result Date: 07/01/2021 CLINICAL DATA:  Dyspnea EXAM: CHEST - 2 VIEW COMPARISON:  11/12/2013 FINDINGS: Lungs are well expanded, symmetric, and clear save for minimal left basilar scarring. No pneumothorax or pleural effusion. Cardiac size within normal limits. Pulmonary vascularity is normal. Osseous structures are age-appropriate. No acute bone abnormality. IMPRESSION: No active cardiopulmonary disease. Electronically Signed   By: Fidela Salisbury M.D.   On: 07/01/2021 22:33    Procedures Procedures    Medications Ordered in ED Medications - No data to display  ED Course/ Medical Decision Making/ A&P                           Medical Decision Making   81 year old male with past medical history significant for depression, restless leg syndrome, osteoarthritis, gastritis and duodenitis, CHF, CAD, GERD who presents to the emergency department after an adverse reaction to medication.  The patient states that he recently started taking niacin as an OTC supplement.  He took a 500 mg tablet earlier tonight and shortly thereafter developed sudden onset flushing, itching/burning of the skin and shortness of breath with mild nausea.  Symptoms lasted for around 4 hours and have since resolved.  He has been in the emergency department for over 10 hours without recurrence of symptoms.  He has not taken the niacin in that dose before.  On arrival, the patient was vitally stable.  No evidence for anaphylaxis.  No evidence for ongoing allergic reaction.  Symptoms arose shortly after taking niacin for the first time, now resolved.  Strongly suggests adverse reaction to niacin.  The patient was advised to cease the high-dose niacin supplement as he could easily get niacin supplementation through a protein rich/meat diet and a daily multivitamin.  Patient endorsed understanding will follow-up outpatient with his PCP.   Final Clinical Impression(s) /  ED Diagnoses Final diagnoses:  Adverse effect of nicotinic acid, initial encounter    Rx / DC Orders ED Discharge Orders     None         Regan Lemming, MD 07/02/21 2482    Regan Lemming, MD 07/02/21 [phone]

## 2021-07-02 NOTE — ED Notes (Signed)
Patient states he started getting red from his head to his feet approx. 730 pm yest after taking a Vit B-3, states that was the first time he had taken it. C/o sob and itching however sx. Have resolved. . Patient is alert and oriented , wife at bedside.

## 2021-07-02 NOTE — Discharge Instructions (Addendum)
You were evaluated in the Emergency Department and after careful evaluation, we did not find any emergent condition requiring admission or further testing in the hospital.  Your exam/testing today was overall reassuring.  Your symptoms are most consistent with an adverse reaction to niacin which is common at higher doses called "niacin flush."  Recommend you stop taking extra supplementation of niacin as you can obtain a normal now through a diet rich in protein/meats and through a multivitamin daily.  Please return to the Emergency Department if you experience any worsening of your condition.  Thank you for allowing [place] to be a part of your care.

## 2021-07-03 ENCOUNTER — Telehealth: Payer: Self-pay

## 2021-07-03 NOTE — Telephone Encounter (Signed)
Left message for pt to call and let us know how he is doing after recent ER Visit.

## 2021-08-28 ENCOUNTER — Emergency Department (HOSPITAL_COMMUNITY): Payer: Medicare HMO

## 2021-08-28 ENCOUNTER — Other Ambulatory Visit: Payer: Self-pay

## 2021-08-28 ENCOUNTER — Emergency Department (HOSPITAL_COMMUNITY)
Admission: EM | Admit: 2021-08-28 | Discharge: 2021-08-28 | Disposition: A | Discharge status: Home / Self Care | Payer: Medicare HMO | Attending: Emergency Medicine | Admitting: Emergency Medicine

## 2021-08-28 ENCOUNTER — Encounter (HOSPITAL_COMMUNITY): Payer: Self-pay

## 2021-08-28 DIAGNOSIS — I639 Cerebral infarction, unspecified: Secondary | ICD-10-CM | POA: Diagnosis not present

## 2021-08-28 DIAGNOSIS — I110 Hypertensive heart disease with heart failure: Secondary | ICD-10-CM | POA: Insufficient documentation

## 2021-08-28 DIAGNOSIS — R42 Dizziness and giddiness: Secondary | ICD-10-CM | POA: Diagnosis not present

## 2021-08-28 DIAGNOSIS — I6782 Cerebral ischemia: Secondary | ICD-10-CM | POA: Diagnosis not present

## 2021-08-28 DIAGNOSIS — I509 Heart failure, unspecified: Secondary | ICD-10-CM | POA: Insufficient documentation

## 2021-08-28 DIAGNOSIS — Z20822 Contact with and (suspected) exposure to covid-19: Secondary | ICD-10-CM | POA: Diagnosis not present

## 2021-08-28 DIAGNOSIS — I447 Left bundle-branch block, unspecified: Secondary | ICD-10-CM | POA: Diagnosis not present

## 2021-08-28 DIAGNOSIS — R29818 Other symptoms and signs involving the nervous system: Secondary | ICD-10-CM | POA: Diagnosis not present

## 2021-08-28 DIAGNOSIS — I10 Essential (primary) hypertension: Secondary | ICD-10-CM | POA: Diagnosis not present

## 2021-08-28 DIAGNOSIS — G319 Degenerative disease of nervous system, unspecified: Secondary | ICD-10-CM | POA: Diagnosis not present

## 2021-08-28 DIAGNOSIS — I2510 Atherosclerotic heart disease of native coronary artery without angina pectoris: Secondary | ICD-10-CM | POA: Diagnosis not present

## 2021-08-28 DIAGNOSIS — R112 Nausea with vomiting, unspecified: Secondary | ICD-10-CM | POA: Diagnosis not present

## 2021-08-28 DIAGNOSIS — Z79899 Other long term (current) drug therapy: Secondary | ICD-10-CM | POA: Diagnosis not present

## 2021-08-28 DIAGNOSIS — R55 Syncope and collapse: Secondary | ICD-10-CM | POA: Diagnosis not present

## 2021-08-28 DIAGNOSIS — R110 Nausea: Secondary | ICD-10-CM | POA: Diagnosis not present

## 2021-08-28 DIAGNOSIS — J320 Chronic maxillary sinusitis: Secondary | ICD-10-CM | POA: Diagnosis not present

## 2021-08-28 LAB — CBC
HCT: 42.6 % (ref 39.0–52.0)
Hemoglobin: 14 g/dL (ref 13.0–17.0)
MCH: 30.4 pg (ref 26.0–34.0)
MCHC: 32.9 g/dL (ref 30.0–36.0)
MCV: 92.4 fL (ref 80.0–100.0)
Platelets: 266 10*3/uL (ref 150–400)
RBC: 4.61 MIL/uL (ref 4.22–5.81)
RDW: 13.4 % (ref 11.5–15.5)
WBC: 10.8 10*3/uL — ABNORMAL HIGH (ref 4.0–10.5)
nRBC: 0 % (ref 0.0–0.2)

## 2021-08-28 LAB — COMPREHENSIVE METABOLIC PANEL
ALT: 13 U/L (ref 0–44)
AST: 17 U/L (ref 15–41)
Albumin: 3.7 g/dL (ref 3.5–5.0)
Alkaline Phosphatase: 70 U/L (ref 38–126)
Anion gap: 10 (ref 5–15)
BUN: 15 mg/dL (ref 8–23)
CO2: 24 mmol/L (ref 22–32)
Calcium: 9 mg/dL (ref 8.9–10.3)
Chloride: 105 mmol/L (ref 98–111)
Creatinine, Ser: 1.34 mg/dL — ABNORMAL HIGH (ref 0.61–1.24)
GFR, Estimated: 53 mL/min — ABNORMAL LOW (ref >60)
Glucose, Bld: 179 mg/dL — ABNORMAL HIGH (ref 70–99)
Potassium: 3.7 mmol/L (ref 3.5–5.1)
Sodium: 139 mmol/L (ref 135–145)
Total Bilirubin: 1 mg/dL (ref 0.3–1.2)
Total Protein: 7.2 g/dL (ref 6.5–8.1)

## 2021-08-28 LAB — DIFFERENTIAL
Abs Immature Granulocytes: 0.03 10*3/uL (ref 0.00–0.07)
Basophils Absolute: 0.1 10*3/uL (ref 0.0–0.1)
Basophils Relative: 1 %
Eosinophils Absolute: 0.1 10*3/uL (ref 0.0–0.5)
Eosinophils Relative: 1 %
Immature Granulocytes: 0 %
Lymphocytes Relative: 31 %
Lymphs Abs: 3.3 10*3/uL (ref 0.7–4.0)
Monocytes Absolute: 0.8 10*3/uL (ref 0.1–1.0)
Monocytes Relative: 8 %
Neutro Abs: 6.4 10*3/uL (ref 1.7–7.7)
Neutrophils Relative %: 59 %

## 2021-08-28 LAB — PROTIME-INR
INR: 1 (ref 0.8–1.2)
Prothrombin Time: 12.8 seconds (ref 11.4–15.2)

## 2021-08-28 LAB — I-STAT CHEM 8, ED
BUN: 18 mg/dL (ref 8–23)
Calcium, Ion: 1.16 mmol/L (ref 1.15–1.40)
Chloride: 104 mmol/L (ref 98–111)
Creatinine, Ser: 1.1 mg/dL (ref 0.61–1.24)
Glucose, Bld: 166 mg/dL — ABNORMAL HIGH (ref 70–99)
HCT: 42 % (ref 39.0–52.0)
Hemoglobin: 14.3 g/dL (ref 13.0–17.0)
Potassium: 3.9 mmol/L (ref 3.5–5.1)
Sodium: 141 mmol/L (ref 135–145)
TCO2: 27 mmol/L (ref 22–32)

## 2021-08-28 LAB — ETHANOL: Alcohol, Ethyl (B): <10 mg/dL (ref <10)

## 2021-08-28 LAB — RESP PANEL BY RT-PCR (FLU A&B, COVID) ARPGX2
Influenza A by PCR: NEGATIVE
Influenza B by PCR: NEGATIVE
SARS Coronavirus 2 by RT PCR: NEGATIVE

## 2021-08-28 LAB — APTT: aPTT: 23 seconds — ABNORMAL LOW (ref 24–36)

## 2021-08-28 MED ORDER — MECLIZINE HCL 25 MG PO TABS
25.0000 mg | ORAL_TABLET | Freq: Once | ORAL | Status: AC
  Administered 2021-08-28: 25 mg via ORAL
  Filled 2021-08-28: qty 1

## 2021-08-28 MED ORDER — LORAZEPAM 2 MG/ML IJ SOLN
2.0000 mg | Freq: Once | INTRAMUSCULAR | Status: AC
  Administered 2021-08-28: 2 mg via INTRAVENOUS
  Filled 2021-08-28: qty 1

## 2021-08-28 MED ORDER — MECLIZINE HCL 25 MG PO TABS: 25.0000 mg | ORAL_TABLET | Freq: Three times a day (TID) | ORAL | 0 refills | Status: AC | PRN

## 2021-08-28 NOTE — ED Notes (Signed)
Pt returned from MRI °

## 2021-08-28 NOTE — Code Documentation (Signed)
Responded to Code Stroke called at 0301 on pt already in ED. Pt came to ED via EMS d/t dizziness. Code Stroke was initiated for dizziness, [id number]. NIH-0. CT head negative for acute changes. TNK not given:NIH-0. Plan for stroke workup. ?

## 2021-08-28 NOTE — ED Notes (Signed)
Patient verbalizes understanding of discharge instructions. Opportunity for questioning and answers were provided. Armband removed by staff, pt discharged from ED and ambulated to lobby to return home.   

## 2021-08-28 NOTE — Consult Note (Signed)
?                    NEURO HOSPITALIST CONSULT NOTE  ? ?Requestig physician: Dr. Christy Gentles ? ?Reason for Consult:Acute onset of vertigo ? ?History obtained from:   Patient and Chart    ? ?HPI:                                                                                                                                         ? Caleb Chavez is an 81 y.o. male with an extensive PMHx as listed below, including stroke risk factors of CAD, CHF and ED (increased association with PVD), who presents to the ED with acute onset of vertigo described as a spinning sensation associated with nausea that was noted at 0030 while he was lying in bed awake. He states he struggled to sit up through the dizziness and nausea, but was able to call 911. He denies any other symptoms including no weakness, facial droop, slurred speech, vision loss, aphasia, confusion, limb weakness, limb numbness or acute pain. He has no prior history of stroke or of vertigo.  ? ?Past Medical History:  ?Diagnosis Date  ? Actinic keratosis 07/14/2012  ? CAD (coronary artery disease)   ? Non obstructive (30-40% Circ, 40% RCA AM,2008)  ? CHF (congestive heart failure) ([place])   ? Depression   ? Dyshidrotic eczema   ? 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 Qualifier: Diagnosis of  By: Lurlean Nanny LPN, [place]    ? ED (erectile dysfunction)   ? Eustachian tube dysfunction 08/25/2017  ? Gastritis and duodenitis   ? GERD 10/20/2006  ? Qualifier: Diagnosis of  By: Lurlean Nanny LPN, Regina    ? GERD (gastroesophageal reflux disease)   ? improved   ? Heart failure   ? EF 40%, Echo 01-2009  ? Ischemic dilated cardiomyopathy ([place]) 01/25/2009  ? EF ~40% on echo 8/10   ? OA (osteoarthritis)   ? 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 Qualifier: Diagnosis of  By: Silvio Pate MD, [facility]   ? Polyp of sigmoid colon   ? Polyp of transverse colon   ? PONV (postoperative nausea and vomiting)   ? past hx of PONV if under anesthesia a long period   ? Renal disorder   ? kidney stones  ? RESTLESS LEG  SYNDROME 10/20/2006  ? Qualifier: Diagnosis of  By: Lurlean Nanny LPN, Regina    ? Right shoulder pain 07/20/2019  ? RLS (restless legs syndrome)   ? Sleep disturbance   ? due to RLS  ? ? ?Past Surgical History:  ?Procedure Laterality Date  ? CARPAL TUNNEL RELEASE Bilateral 05/2003  ? Right ([place])  ? CATARACT EXTRACTION  10/2008  ? Left  ? CATARACT EXTRACTION  11/2008  ? Right  ? COLONOSCOPY    ? COLONOSCOPY WITH PROPOFOL N/A 02/12/2018  ? Procedure: COLONOSCOPY WITH PROPOFOL;  Surgeon: Owens Loffler  P, MD;  Location: [facility] ENDOSCOPY;  Service: Endoscopy;  Laterality: N/A;  ? ESOPHAGOGASTRIC FUNDOPLICATION  5732  ? scope-[facility]  ? KIDNEY STONE SURGERY  1997/2000  ? LAPAROSCOPIC CHOLECYSTECTOMY  04/2009  ? Dr. Johney Maine  ? POLYPECTOMY  02/12/2018  ? Procedure: POLYPECTOMY;  Surgeon: Milus Banister, MD;  Location: [facility];  Service: Endoscopy;;  ? UPPER GASTROINTESTINAL ENDOSCOPY    ? URETEROSCOPY  05/2008  ? [place]  ? ? ?Family History  ?Problem Relation Age of Onset  ? Heart attack Father   ?     4 total and bypass  ? Heart disease Father   ? Cancer Mother   ?     unknown type  ? Arthritis Sister   ? Colon cancer Neg Hx   ? Colon polyps Neg Hx   ? Esophageal cancer Neg Hx   ? Rectal cancer Neg Hx   ? Stomach cancer Neg Hx   ?          ? ?Social History:  reports that he has never smoked. He has never used smokeless tobacco. He reports current alcohol use. He reports that he does not use drugs. ? ?Allergies  ?Allergen Reactions  ? Penicillins Hives  ?  Has patient had a PCN reaction causing immediate rash, facial/tongue/throat swelling, SOB or lightheadedness with hypotension: Yes ?Has patient had a PCN reaction causing severe rash involving mucus membranes or skin necrosis: No ?Has patient had a PCN reaction that required hospitalization: No ?Has patient had a PCN reaction occurring within the last 10 years: No ?If all of the above answers are "NO", then may proceed with Cephalosporin use. ?  ? ? ?HOME MEDICATIONS:                                                                                                                      ? ?No current facility-administered medications on file prior to encounter.  ? ?Current Outpatient Medications on File Prior to Encounter  ?Medication Sig Dispense Refill  ? acetaminophen (TYLENOL) 650 MG CR tablet Take 1,300 mg by mouth 2 (two) times daily.    ? furosemide (LASIX) 20 MG tablet Take 1 tablet (20 mg total) by mouth daily. 90 tablet 3  ? metoprolol succinate (TOPROL-XL) 25 MG 24 hr tablet Take 1 tablet (25 mg total) by mouth daily. 90 tablet 3  ? rosuvastatin (CRESTOR) 20 MG tablet Take 1 tablet (20 mg total) by mouth daily. 90 tablet 2  ? sacubitril-valsartan (ENTRESTO) 24-26 MG Take 1 tablet by mouth 2 (two) times daily. 180 tablet 2  ? ? ? ?ROS:                                                                                                                                       ?  As per HPI. Does not endorse any additional symptoms.  ? ? ?Blood pressure (!) 126/99, pulse 81, temperature (!) 97.1 ?F (36.2 ?C), temperature source Oral, resp. rate 16, height 6' (1.829 m), weight 108 kg, SpO2 98 %. ? ? ?General Examination:                                                                                                      ? ?Physical Exam  ?HEENT-  [place]/AT   ?Lungs- Respirations unlabored ?Extremities- Warm and well perfused ? ?Neurological Examination ?Mental Status: Alert, oriented x 5, thought content appropriate.  Speech fluent without evidence of aphasia.  Able to follow all commands without difficulty. ?Cranial Nerves: ?II: Temporal visual fields intact with no extinction to DSS.   ?III,IV, VI: EOMI horizontally and vertically without nystagmus.  ?VII: Smile symmetric ?V: Unremarkable.  ?VIII: Hearing intact to voice ?IX,X: No hypophonia or hoarseness ?XI: Symmetric shoulder shrug ?XII: Midline tongue  ?Motor: ?Right : Upper extremity   5/5    Left:     Upper extremity   5/5 ? Lower extremity    5/5     Lower extremity   5/5 ?No pronator drift ?Sensory: Temp and light touch intact throughout, bilaterally. No extinction to DSS.  ?Deep Tendon Reflexes: 1+ and symmetric brachioradialis and patellar reflexes ?Plantars: Right: downgoing  Left: downgoing ?Cerebellar: No ataxia with FNF and H-S bilaterally ?Gait: Deferred ?  ?Lab Results: ?Basic Metabolic Panel: ?No results for input(s): NA, K, CL, CO2, GLUCOSE, BUN, CREATININE, CALCIUM, MG, PHOS in the last 168 hours. ? ?CBC: ?No results for input(s): WBC, NEUTROABS, HGB, HCT, MCV, PLT in the last 168 hours. ? ?Cardiac Enzymes: ?No results for input(s): CKTOTAL, CKMB, CKMBINDEX, TROPONINI in the last 168 hours. ? ?Lipid Panel: ?No results for input(s): CHOL, TRIG, HDL, CHOLHDL, VLDL, LDLCALC in the last 168 hours. ? ?Imaging: ?CT HEAD CODE STROKE WO CONTRAST ? ?Result Date: 08/28/2021 ?CLINICAL DATA:  Code stroke.  Acute neurologic deficit EXAM: CT HEAD WITHOUT CONTRAST TECHNIQUE: Contiguous axial images were obtained from the base of the skull through the vertex without intravenous contrast. RADIATION DOSE REDUCTION: This exam was performed according to the departmental dose-optimization program which includes automated exposure control, adjustment of the mA and/or kV according to patient size and/or use of iterative reconstruction technique. COMPARISON:  None. FINDINGS: Brain: There is no mass, hemorrhage or extra-axial collection. There is generalized atrophy without lobar predilection. There is hypoattenuation of the periventricular white matter, most commonly indicating chronic ischemic microangiopathy. Vascular: No abnormal hyperdensity of the major intracranial arteries or dural venous sinuses. No intracranial atherosclerosis. Skull: The visualized skull base, calvarium and extracranial soft tissues are normal. Sinuses/Orbits: Left maxillary sinus opacification. Small amount of left mastoid fluid. The orbits are normal. ASPECTS [facility] Stroke Program Early  CT Score) - Ganglionic level infarction (caudate, lentiform nuclei, internal capsule, insula, M1-M3 cortex): 7 - Supraganglionic infarction (M4-M6 cortex): 3 Total score (0-10 with 10 being normal): 10 IMPR

## 2021-08-28 NOTE — ED Notes (Signed)
Patient transported to MRI 

## 2021-08-28 NOTE — ED Notes (Signed)
Called [facility] to activate code stroke  

## 2021-08-28 NOTE — ED Notes (Signed)
Patient transported to CT 

## 2021-08-28 NOTE — ED Provider Notes (Signed)
?[facility] ?Provider Note ? ? ?CSN: [id number] ?Arrival date & time: 08/28/21  4098 ? ?An emergency department physician performed an initial assessment on this suspected stroke patient at 0300. ? ?History ? ?Chief Complaint  ?Patient presents with  ? Dizziness  ? Code Stroke  ? ? ?Caleb Chavez is a 81 y.o. male. ? ?The history is provided by the patient.  ?Dizziness ?Quality:  Room spinning ?Severity:  Severe ?Onset quality:  Sudden ?Duration:  3 hours ?Timing:  Constant ?Progression:  Unchanged ?Chronicity:  New ?Relieved by:  Nothing ?Worsened by:  Closing eyes ?Associated symptoms: nausea and vomiting   ?Associated symptoms: no chest pain, no headaches, no shortness of breath, no syncope, no tinnitus and no vision changes   ?Patient with history of CAD, CHF presents with acute vertigo.  Patient reports that at approximately 12:30 AM he had onset of dizziness and vertigo.  Reports he laid in bed for several hours but never slept.  Reports generalized weakness.  Has had nausea and vomiting. ?  ?Past Medical History:  ?Diagnosis Date  ? Actinic keratosis 07/14/2012  ? CAD (coronary artery disease)   ? Non obstructive (30-40% Circ, 40% RCA AM,2008)  ? CHF (congestive heart failure) ([place])   ? Depression   ? Dyshidrotic eczema   ? 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 Qualifier: Diagnosis of  By: Lurlean Nanny LPN, Regina    ? ED (erectile dysfunction)   ? Eustachian tube dysfunction 08/25/2017  ? Gastritis and duodenitis   ? GERD 10/20/2006  ? Qualifier: Diagnosis of  By: Lurlean Nanny LPN, Regina    ? GERD (gastroesophageal reflux disease)   ? improved   ? Heart failure   ? EF 40%, Echo 01-2009  ? Ischemic dilated cardiomyopathy ([place]) 01/25/2009  ? EF ~40% on echo 8/10   ? OA (osteoarthritis)   ? 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 Qualifier: Diagnosis of  By: Silvio Pate MD, [facility]   ? Polyp of sigmoid colon   ? Polyp of transverse colon   ? PONV (postoperative nausea and vomiting)   ? past hx of  PONV if under anesthesia a long period   ? Renal disorder   ? kidney stones  ? RESTLESS LEG SYNDROME 10/20/2006  ? Qualifier: Diagnosis of  By: Lurlean Nanny LPN, Regina    ? Right shoulder pain 07/20/2019  ? RLS (restless legs syndrome)   ? Sleep disturbance   ? due to RLS  ? ? ?Home Medications ?Prior to Admission medications   ?Medication Sig Start Date End Date Taking? Authorizing Provider  ?acetaminophen (TYLENOL) 650 MG CR tablet Take 1,300 mg by mouth every 8 (eight) hours as needed for pain.   Yes [provider]  ?meclizine (ANTIVERT) 25 MG tablet Take 1 tablet (25 mg total) by mouth 3 (three) times daily as needed for dizziness. 08/28/21  Yes Ripley Fraise, MD  ?metoprolol succinate (TOPROL-XL) 25 MG 24 hr tablet Take 1 tablet (25 mg total) by mouth daily. ?Patient taking differently: Take 25 mg by mouth at bedtime. 04/03/21  Yes Jerline Pain, MD  ?rosuvastatin (CRESTOR) 20 MG tablet Take 1 tablet (20 mg total) by mouth daily. ?Patient taking differently: Take 20 mg by mouth at bedtime. 06/12/21  Yes Jerline Pain, MD  ?sacubitril-valsartan (ENTRESTO) 24-26 MG Take 1 tablet by mouth 2 (two) times daily. 04/03/21  Yes Jerline Pain, MD  ?furosemide (LASIX) 20 MG tablet Take 1 tablet (20 mg total) by mouth daily. ?Patient  not taking: Reported on 08/28/2021 08/25/19   Jerline Pain, MD  ?   ? ?Allergies    ?Penicillins   ? ?Review of Systems   ?Review of Systems  ?Constitutional:  Negative for fever.  ?HENT:  Negative for tinnitus.   ?Eyes:  Negative for visual disturbance.  ?Respiratory:  Negative for shortness of breath.   ?Cardiovascular:  Negative for chest pain and syncope.  ?Gastrointestinal:  Positive for nausea and vomiting.  ?Neurological:  Positive for dizziness. Negative for headaches.  ? ?Physical Exam ?Updated Vital Signs ?BP (!) 143/70   Pulse 74   Temp (!) 97.1 ?F (36.2 ?C) (Oral)   Resp 17   Ht 1.829 m (6')   Wt 108 kg   SpO2 96%   BMI 32.28 kg/m?  ?Physical Exam ?CONSTITUTIONAL:  Elderly, no acute distress ?HEAD: Normocephalic/atraumatic ?EYES: EOMI/PERRL, no nystagmus, no visual ?field deficit  no ptosis ?ENMT: Mucous membranes moist ?NECK: supple no meningeal signs ?CV: S1/S2 noted, no murmurs/rubs/gallops noted ?LUNGS: Lungs are clear to auscultation bilaterally, no apparent distress ?ABDOMEN: soft, nontender, no rebound or guarding ?GU:no cva tenderness ?NEURO:Awake/alert, face symmetric, no arm or leg drift is noted ?Equal 5/5 strength with hip flexion,knee flex/extension, foot dorsi/plantar flexion ?Cranial nerves 3/4/5/6/12/16/08/11/12 tested and intact ?Sensation to light touch intact in all extremities ?NIHSS-deferred until after CT head ?EXTREMITIES: pulses normal, full ROM ?SKIN: warm, color normal ?PSYCH: no abnormalities of mood noted ? ?ED Results / Procedures / Treatments   ?Labs ?(all labs ordered are listed, but only abnormal results are displayed) ?Labs Reviewed  ?APTT - Abnormal; Notable for the following components:  ?    Result Value  ? aPTT 23 (*)   ? All other components within normal limits  ?CBC - Abnormal; Notable for the following components:  ? WBC 10.8 (*)   ? All other components within normal limits  ?COMPREHENSIVE METABOLIC PANEL - Abnormal; Notable for the following components:  ? Glucose, Bld 179 (*)   ? Creatinine, Ser 1.34 (*)   ? GFR, Estimated 53 (*)   ? All other components within normal limits  ?I-STAT CHEM 8, ED - Abnormal; Notable for the following components:  ? Glucose, Bld 166 (*)   ? All other components within normal limits  ?RESP PANEL BY RT-PCR (FLU A&B, COVID) [id number]  ?ETHANOL  ?PROTIME-INR  ?DIFFERENTIAL  ? ? ?EKG ?EKG Interpretation ? ?Date/Time:  Tuesday August 28 2021 02:44:24 EDT ?Ventricular Rate:  64 ?PR Interval:  195 ?QRS Duration: 205 ?QT Interval:  510 ?QTC Calculation: 527 ?R Axis:   -52 ?Text Interpretation: Sinus rhythm Left bundle branch block No significant change since last tracing Confirmed by Ripley Fraise [phone]) on  08/28/2021 2:49:05 AM ? ?Radiology ?MR BRAIN WO CONTRAST ? ?Result Date: 08/28/2021 ?CLINICAL DATA:  Dizziness and nausea beginning this morning, stroke suspected EXAM: MRI HEAD WITHOUT CONTRAST TECHNIQUE: Multiplanar, multiecho pulse sequences of the brain and surrounding structures were obtained without intravenous contrast. COMPARISON:  Head CT from earlier today FINDINGS: Brain: No acute infarction, hemorrhage, hydrocephalus, extra-axial collection or mass lesion. Chronic small vessel ischemia in the hemispheric white matter with chronic left centrum semiovale lacune. Small remote right cerebellar infarction. Vascular: Major flow voids are preserved. Skull and upper cervical spine: Normal marrow signal. Cervical spine degeneration especially affecting C3-4 facets with anterolisthesis. Sinuses/Orbits: Chronic left maxillary sinus opacification by peripheral mucosal thickening and central inspissated material, with atelectasis. Partial left more than right mastoid opacification with negative nasopharynx. Bilateral cataract resection. IMPRESSION:  1. No acute or reversible finding. 2. Brain atrophy and chronic ischemic vessel disease. 3. Chronic left maxillary sinusitis and partial mastoid opacification. Electronically Signed   By: Jorje Guild M.D.   On: 08/28/2021 06:21  ? ?CT HEAD CODE STROKE WO CONTRAST ? ?Result Date: 08/28/2021 ?CLINICAL DATA:  Code stroke.  Acute neurologic deficit EXAM: CT HEAD WITHOUT CONTRAST TECHNIQUE: Contiguous axial images were obtained from the base of the skull through the vertex without intravenous contrast. RADIATION DOSE REDUCTION: This exam was performed according to the departmental dose-optimization program which includes automated exposure control, adjustment of the mA and/or kV according to patient size and/or use of iterative reconstruction technique. COMPARISON:  None. FINDINGS: Brain: There is no mass, hemorrhage or extra-axial collection. There is generalized atrophy  without lobar predilection. There is hypoattenuation of the periventricular white matter, most commonly indicating chronic ischemic microangiopathy. Vascular: No abnormal hyperdensity of the major intracranial arteries or du

## 2021-08-28 NOTE — ED Triage Notes (Signed)
PER EMS: pt from home with c/o dizziness and nausea onset this morning at 0130 waking him up from his sleep, described it as the room was spinning. Denies pain. A&OX4. ? ?BP- 181/90, HR-64, 94% RA, 18 RR, CBG 166 ? ?20 g LAC inserted by EMS ?

## 2021-08-28 NOTE — ED Notes (Signed)
Ambulated Pt in hallway Pt walked with no problem. O2 sats were 93 on room air. ?

## 2021-09-04 ENCOUNTER — Telehealth: Payer: Self-pay

## 2021-09-04 NOTE — Telephone Encounter (Signed)
Spoke to pt. Made appt with Dr Silvio Pate. ?

## 2021-09-18 ENCOUNTER — Ambulatory Visit (INDEPENDENT_AMBULATORY_CARE_PROVIDER_SITE_OTHER): Payer: Medicare HMO | Admitting: Internal Medicine

## 2021-09-18 ENCOUNTER — Encounter: Payer: Self-pay | Admitting: Internal Medicine

## 2021-09-18 DIAGNOSIS — R42 Dizziness and giddiness: Secondary | ICD-10-CM | POA: Insufficient documentation

## 2021-09-18 DIAGNOSIS — H9193 Unspecified hearing loss, bilateral: Secondary | ICD-10-CM

## 2021-09-18 DIAGNOSIS — H9190 Unspecified hearing loss, unspecified ear: Secondary | ICD-10-CM | POA: Insufficient documentation

## 2021-09-18 NOTE — Assessment & Plan Note (Signed)
Clearly seems vestibular ?MRI reassuring ?Mild symptoms now---just looking down ?Has the meclizine for prn if worsens ?

## 2021-09-18 NOTE — Progress Notes (Signed)
? ?Subjective:  ? ? Patient ID: Caleb Chavez, male    DOB: 07-Nov-1940, 81 y.o.   MRN: [id number] ? ?HPI ?Here for ER follow up for vertigo ?With wife ? ?Awoken 3/21---felt like the bed was flipping ?Had to hold onto bed post till it stopped ?Lasted several seconds ?Broke out in sweat ?Made it to living room----called 911 ?To ER---had brief, less severe vertigo when at ER ? ?No focal weakness, facial droop, no aphasia, etc ?MRI benign ? ?No bad spells---mild dizzy feeling, like if he looks down ?Current Outpatient Medications on File Prior to Visit  ?Medication Sig Dispense Refill  ? acetaminophen (TYLENOL) 650 MG CR tablet Take 1,300 mg by mouth every 8 (eight) hours as needed for pain.    ? furosemide (LASIX) 20 MG tablet Take 1 tablet (20 mg total) by mouth daily. 90 tablet 3  ? metoprolol succinate (TOPROL-XL) 25 MG 24 hr tablet Take 1 tablet (25 mg total) by mouth daily. (Patient taking differently: Take 25 mg by mouth at bedtime.) 90 tablet 3  ? rosuvastatin (CRESTOR) 20 MG tablet Take 1 tablet (20 mg total) by mouth daily. (Patient taking differently: Take 20 mg by mouth at bedtime.) 90 tablet 2  ? sacubitril-valsartan (ENTRESTO) 24-26 MG Take 1 tablet by mouth 2 (two) times daily. 180 tablet 2  ? meclizine (ANTIVERT) 25 MG tablet Take 1 tablet (25 mg total) by mouth 3 (three) times daily as needed for dizziness. (Patient not taking: Reported on 09/18/2021) 30 tablet 0  ? ?No current facility-administered medications on file prior to visit.  ? ? ?Allergies  ?Allergen Reactions  ? Penicillins Hives  ?  Has patient had a PCN reaction causing immediate rash, facial/tongue/throat swelling, SOB or lightheadedness with hypotension: Yes ?Has patient had a PCN reaction causing severe rash involving mucus membranes or skin necrosis: No ?Has patient had a PCN reaction that required hospitalization: No ?Has patient had a PCN reaction occurring within the last 10 years: No ?If all of the above answers are "NO", then may  proceed with Cephalosporin use. ?  ? ? ?Past Medical History:  ?Diagnosis Date  ? Actinic keratosis 07/14/2012  ? CAD (coronary artery disease)   ? Non obstructive (30-40% Circ, 40% RCA AM,2008)  ? CHF (congestive heart failure) ([place])   ? Depression   ? Dyshidrotic eczema   ? 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 Qualifier: Diagnosis of  By: Lurlean Nanny LPN, Regina    ? ED (erectile dysfunction)   ? Eustachian tube dysfunction 08/25/2017  ? Gastritis and duodenitis   ? GERD 10/20/2006  ? Qualifier: Diagnosis of  By: Lurlean Nanny LPN, Regina    ? GERD (gastroesophageal reflux disease)   ? improved   ? Heart failure   ? EF 40%, Echo 01-2009  ? Ischemic dilated cardiomyopathy ([place]) 01/25/2009  ? EF ~40% on echo 8/10   ? OA (osteoarthritis)   ? 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 Qualifier: Diagnosis of  By: Silvio Pate MD, [facility]   ? Polyp of sigmoid colon   ? Polyp of transverse colon   ? PONV (postoperative nausea and vomiting)   ? past hx of PONV if under anesthesia a long period   ? Renal disorder   ? kidney stones  ? RESTLESS LEG SYNDROME 10/20/2006  ? Qualifier: Diagnosis of  By: Lurlean Nanny LPN, Regina    ? Right shoulder pain 07/20/2019  ? RLS (restless legs syndrome)   ? Sleep disturbance   ? due to RLS  ? ? ?  Past Surgical History:  ?Procedure Laterality Date  ? CARPAL TUNNEL RELEASE Bilateral 05/2003  ? Right ([place])  ? CATARACT EXTRACTION  10/2008  ? Left  ? CATARACT EXTRACTION  11/2008  ? Right  ? COLONOSCOPY    ? COLONOSCOPY WITH PROPOFOL N/A 02/12/2018  ? Procedure: COLONOSCOPY WITH PROPOFOL;  Surgeon: Milus Banister, MD;  Location: [facility];  Service: Endoscopy;  Laterality: N/A;  ? ESOPHAGOGASTRIC FUNDOPLICATION  3614  ? scope-[facility]  ? KIDNEY STONE SURGERY  1997/2000  ? LAPAROSCOPIC CHOLECYSTECTOMY  04/2009  ? Dr. Johney Maine  ? POLYPECTOMY  02/12/2018  ? Procedure: POLYPECTOMY;  Surgeon: Milus Banister, MD;  Location: [facility];  Service: Endoscopy;;  ? UPPER GASTROINTESTINAL ENDOSCOPY    ? URETEROSCOPY  05/2008  ? [place]   ? ? ?Family History  ?Problem Relation Age of Onset  ? Heart attack Father   ?     4 total and bypass  ? Heart disease Father   ? Cancer Mother   ?     unknown type  ? Arthritis Sister   ? Colon cancer Neg Hx   ? Colon polyps Neg Hx   ? Esophageal cancer Neg Hx   ? Rectal cancer Neg Hx   ? Stomach cancer Neg Hx   ? ? ?Social History  ? ?Socioeconomic History  ? Marital status: Married  ?  Spouse name: Not on file  ? Number of children: 3  ? Years of education: Not on file  ? Highest education level: Not on file  ?Occupational History  ? Occupation: Retired  ?  Comment: Airline pilot. dispatcher  ? Occupation: PARK MANAGER  ?  Employer: [employer]  ?  Comment: [employer] [place] [employer]--part time  ?Tobacco Use  ? Smoking status: Never  ? Smokeless tobacco: Never  ?Vaping Use  ? Vaping Use: Never used  ?Substance and Sexual Activity  ? Alcohol use: Yes  ?  Comment: occasional  ? Drug use: Never  ? Sexual activity: Not on file  ?Other Topics Concern  ? Not on file  ?Social History Narrative  ? Divorced then remarried 5/12  ?   ? Has living will  ? Requests wife as his health care POA. No alternate at present  ? Would accept resuscitation attempts  ? Probably would accept a feeding tube--at least for some time  ? ?Social Determinants of Health  ? ?Financial Resource Strain: Not on file  ?Food Insecurity: Not on file  ?Transportation Needs: Not on file  ?Physical Activity: Not on file  ?Stress: Not on file  ?Social Connections: Not on file  ?Intimate Partner Violence: Not on file  ? ?Review of Systems ?Hearing is worse now--since this (on both sides) ?Some tinnitus--both ears ?Some trouble with balance---new ?No headache ?No vision change ?   ?Objective:  ? Physical Exam ?Constitutional:   ?   Appearance: Normal appearance.  ?Eyes:  ?   Extraocular Movements: Extraocular movements intact.  ?   Comments: No nystagmus  ?Neurological:  ?   Mental Status: He is alert and oriented to person, place, and  time.  ?   Motor: No weakness or tremor.  ?   Coordination: Coordination is intact.  ?   Gait: Gait is intact.  ?   Comments: No ataxia  ?  ? ? ? ? ?   ?Assessment & Plan:  ? ?

## 2021-09-18 NOTE — Assessment & Plan Note (Signed)
Seemed to come on after the vertigo attack---but not sure they would be related ?Has significant cerumenosis bilaterally ?Will follow up with ENT as set up from ER---to further evaluate this ?

## 2021-11-06 DIAGNOSIS — H93291 Other abnormal auditory perceptions, right ear: Secondary | ICD-10-CM | POA: Diagnosis not present

## 2021-11-06 DIAGNOSIS — H6123 Impacted cerumen, bilateral: Secondary | ICD-10-CM | POA: Diagnosis not present

## 2021-11-06 DIAGNOSIS — R42 Dizziness and giddiness: Secondary | ICD-10-CM | POA: Diagnosis not present

## 2021-11-22 DIAGNOSIS — H6122 Impacted cerumen, left ear: Secondary | ICD-10-CM | POA: Diagnosis not present

## 2021-11-29 DIAGNOSIS — H6122 Impacted cerumen, left ear: Secondary | ICD-10-CM | POA: Diagnosis not present

## 2021-12-07 DIAGNOSIS — H90A21 Sensorineural hearing loss, unilateral, right ear, with restricted hearing on the contralateral side: Secondary | ICD-10-CM | POA: Diagnosis not present

## 2021-12-07 DIAGNOSIS — H90A32 Mixed conductive and sensorineural hearing loss, unilateral, left ear with restricted hearing on the contralateral side: Secondary | ICD-10-CM | POA: Diagnosis not present

## 2022-02-15 ENCOUNTER — Encounter: Payer: Self-pay | Admitting: Cardiology

## 2022-07-30 DIAGNOSIS — R42 Dizziness and giddiness: Secondary | ICD-10-CM | POA: Diagnosis not present

## 2022-07-30 DIAGNOSIS — N5089 Other specified disorders of the male genital organs: Secondary | ICD-10-CM | POA: Diagnosis not present

## 2022-09-20 IMAGING — CT CT HEAD CODE STROKE
3 series · 15 of 47 positions shown, 18 images · non-contrast
Comparison: None.

CLINICAL DATA: Code stroke.  Acute neurologic deficit



[Series 2: head 5.0 st · axial · 0.52mm/px · z∈[-151,+4]mm · 9 of 37 slices shown, 12 images]
[im 3/37  brain]
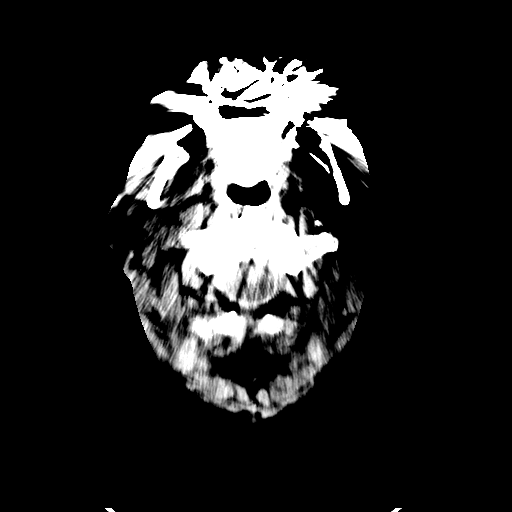
[im 3/37  bone]
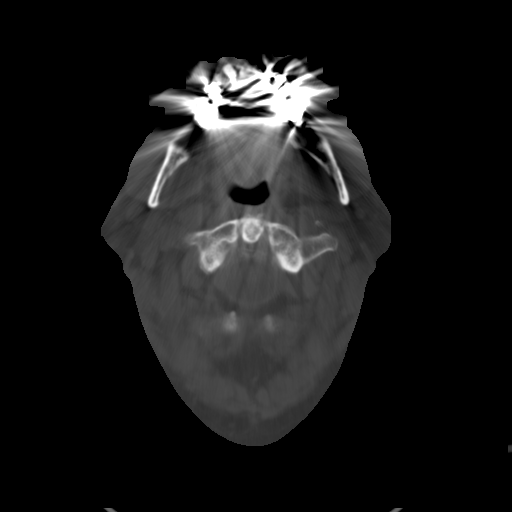
[im 7/37  brain]
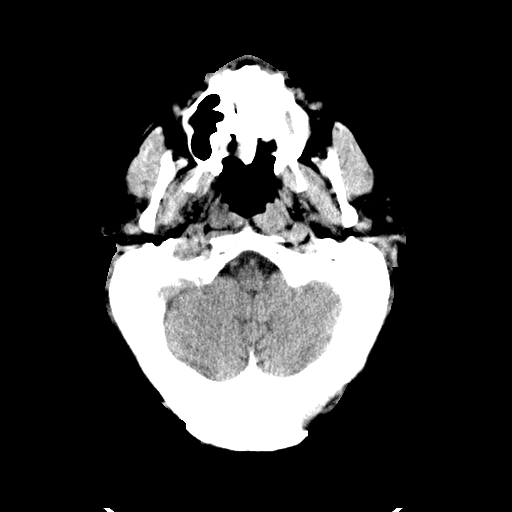
[im 10/37  brain]
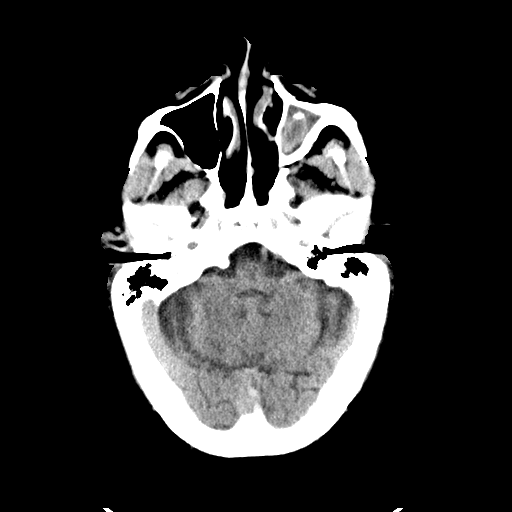
[im 14/37  brain]
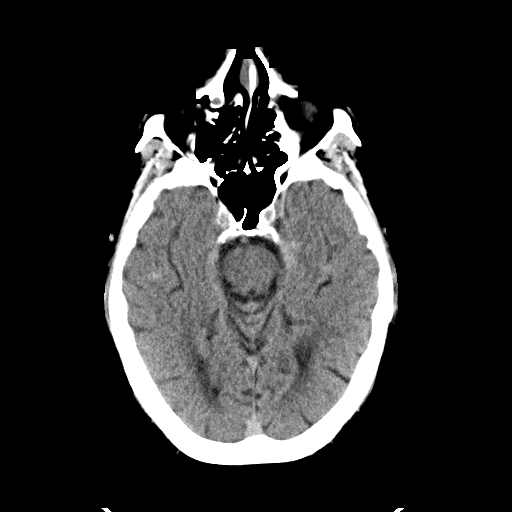
[im 19/37  brain]
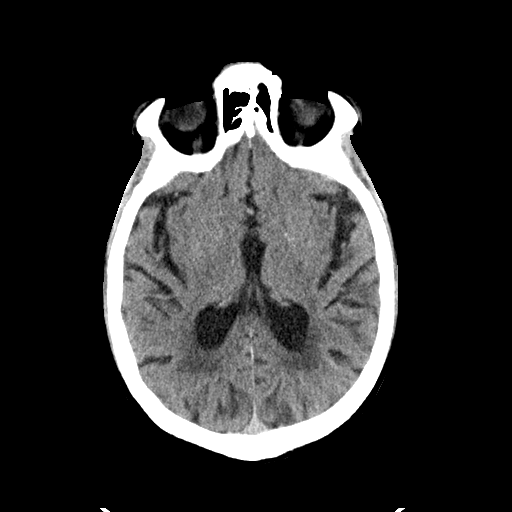
[im 19/37  bone]
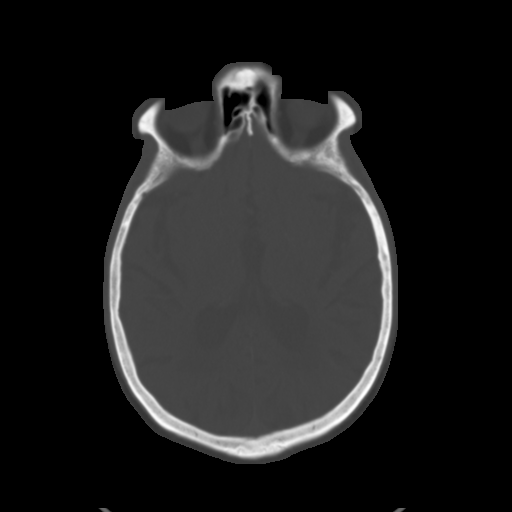
[im 23/37  brain]
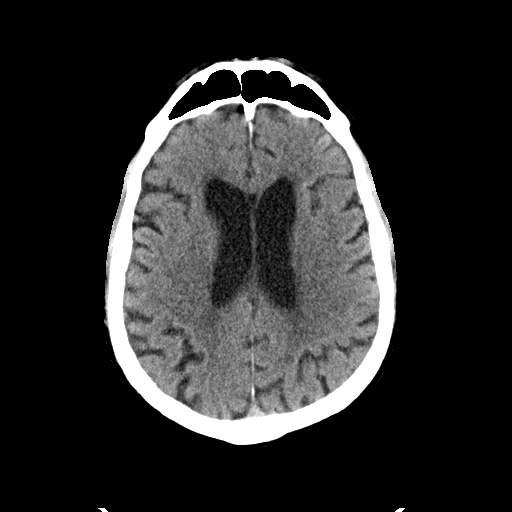
[im 27/37  brain]
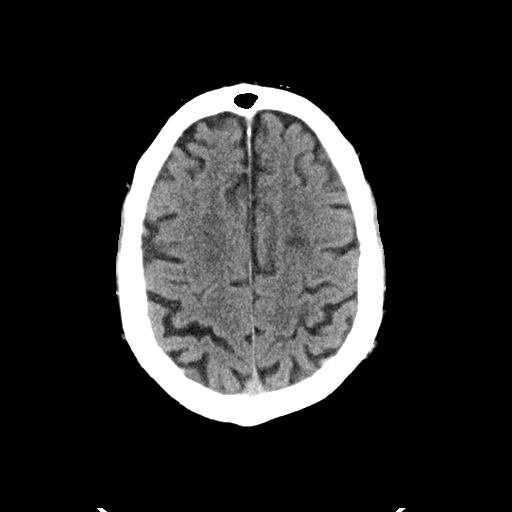
[im 30/37  brain]
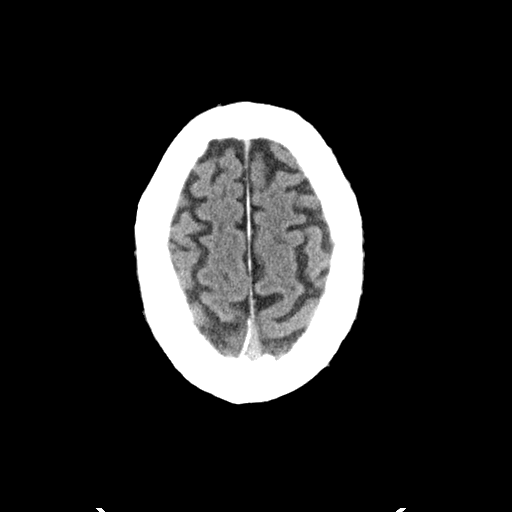
[im 34/37  brain]
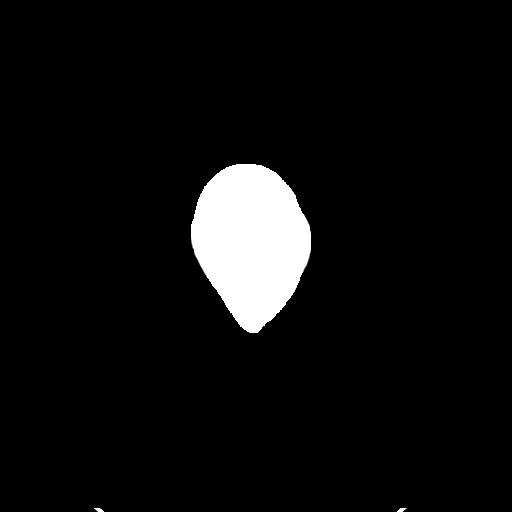
[im 34/37  bone]
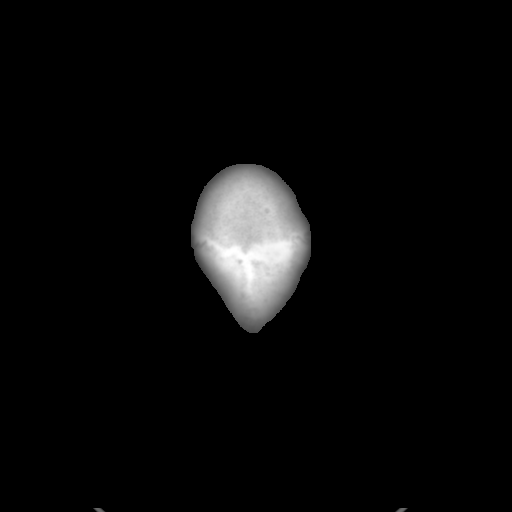

[Series 5: head 3.0 cor st · coronal · 0.38mm/px · 3 of 78 slices shown]
[im 26/78  brain]
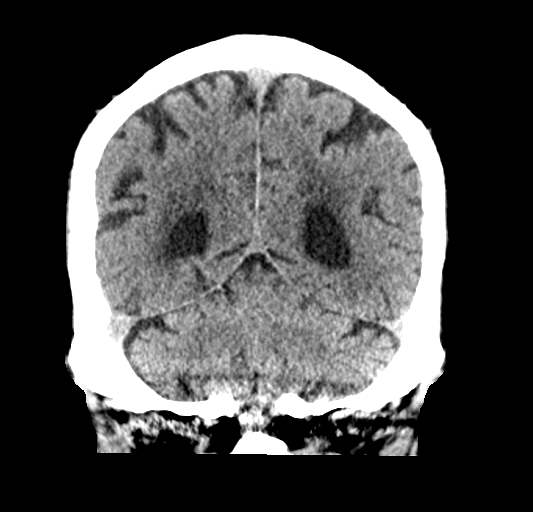
[im 35/78  brain]
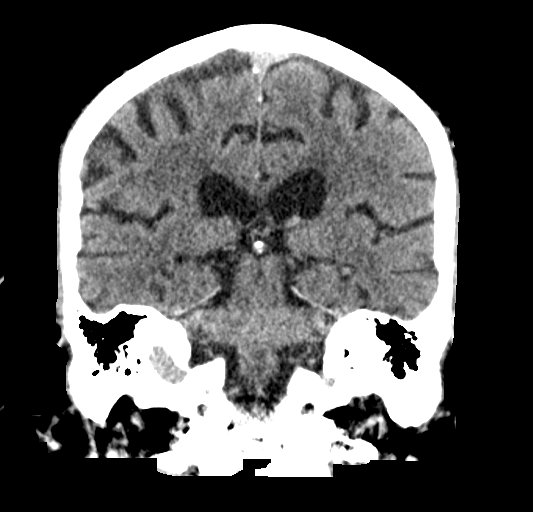
[im 43/78  brain]
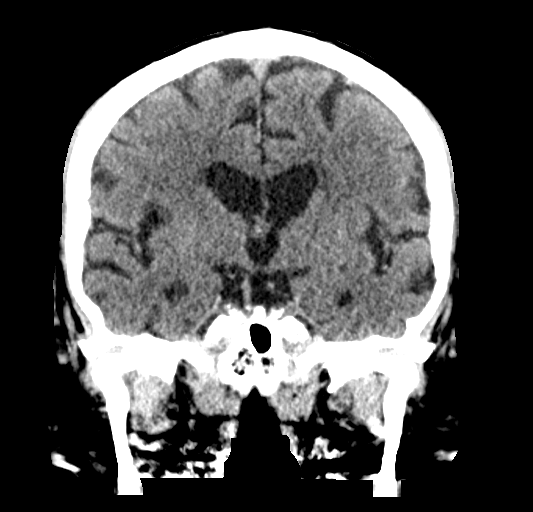

[Series 6: head 3.0 sag st · sagittal · 0.37mm/px · 3 of 60 slices shown]
[im 20/60  brain]
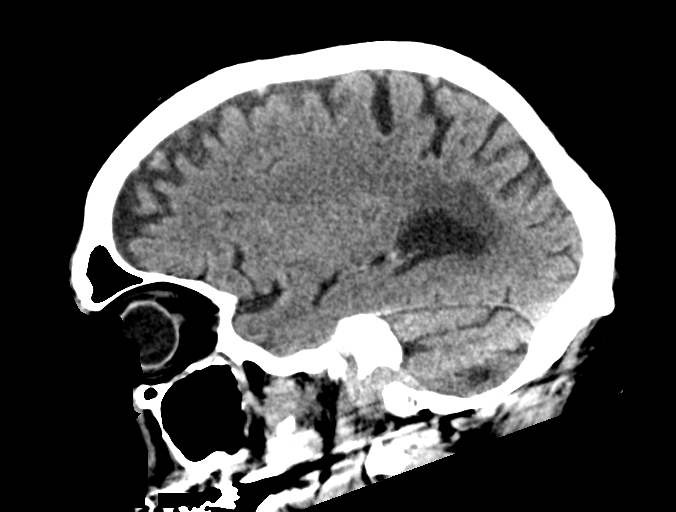
[im 30/60  brain]
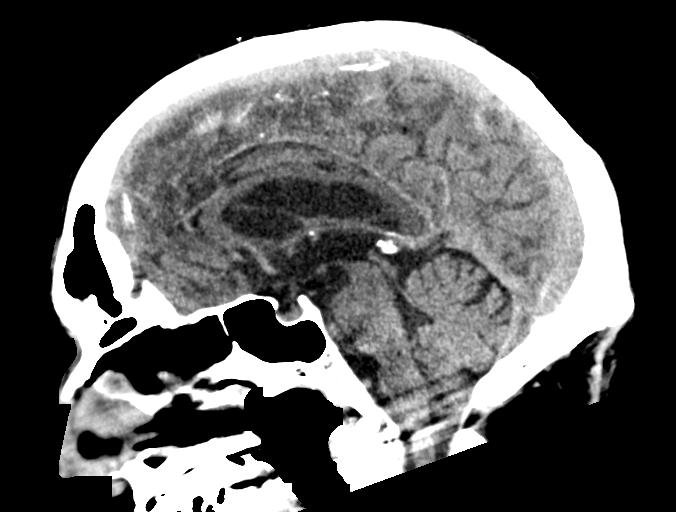
[im 40/60  brain]
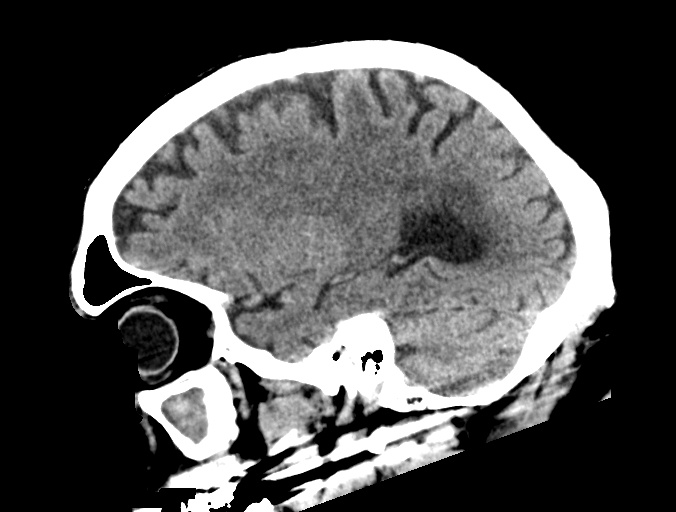

[15 of 47 positions shown; findings below may reference images not displayed]

FINDINGS: Brain: There is no mass, hemorrhage or extra-axial collection. There
is generalized atrophy without lobar predilection. There is
hypoattenuation of the periventricular white matter, most commonly
indicating chronic ischemic microangiopathy.

Vascular: No abnormal hyperdensity of the major intracranial
arteries or dural venous sinuses. No intracranial atherosclerosis.

Skull: The visualized skull base, calvarium and extracranial soft
tissues are normal.

Sinuses/Orbits: Left maxillary sinus opacification. Small amount of
left mastoid fluid. The orbits are normal.

ASPECTS (Alberta Stroke Program Early CT Score)

- Ganglionic level infarction (caudate, lentiform nuclei, internal
capsule, insula, M1-M3 cortex): 7

- Supraganglionic infarction (M4-M6 cortex): 3

Total score (0-10 with 10 being normal): 10
IMPRESSION: 1. No acute intracranial abnormality.
2. ASPECTS is 10.

These results were communicated to Dr. Jaylon Aujla at [DATE] on
08/28/2021 by text page via the [employer] messaging system.

## 2022-09-20 IMAGING — MR MR HEAD W/O CM
12 of 14 series · 42 of 48 positions shown · non-contrast
Comparison: Head CT from earlier today

CLINICAL DATA: Dizziness and nausea beginning this morning, stroke
suspected

EXAM:
MRI HEAD WITHOUT CONTRAST
TECHNIQUE: Multiplanar, multiecho pulse sequences of the brain and surrounding
structures were obtained without intravenous contrast.

[Series 5: DWI · axial · 3.0mm · 0.96mm/px · z∈[-91,+79]mm · 7 of 116 slices shown (1 of 4)]
[im 1/116]
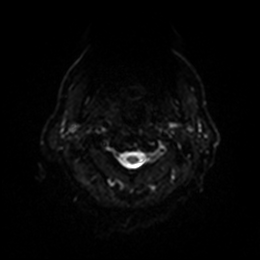
[im 20/116]
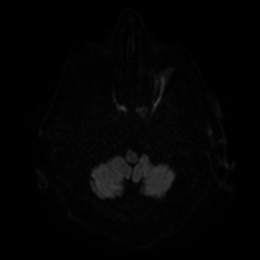
[im 39/116]
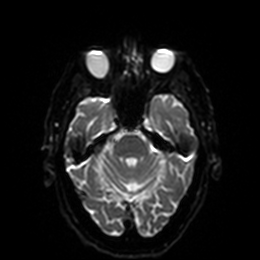
[im 58/116]
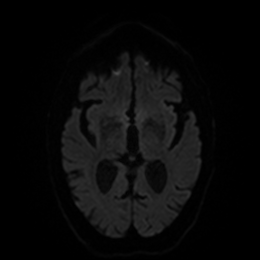
[im 77/116]
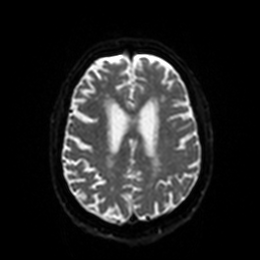
[im 96/116]
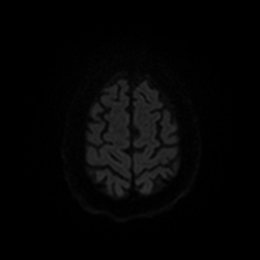
[im 116/116]
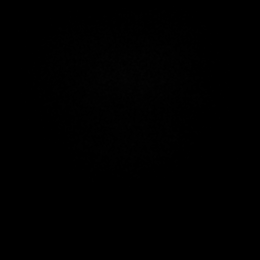

[Series 6: DWI · axial · 3.0mm · 0.96mm/px · z∈[-91,+76]mm · 4 of 57 slices shown (2 of 4)]
[im 1/57]
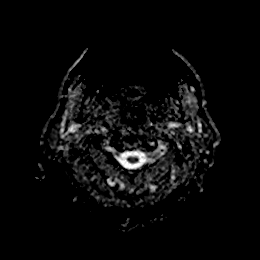
[im 19/57]
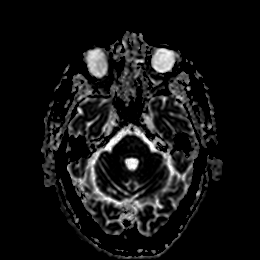
[im 38/57]
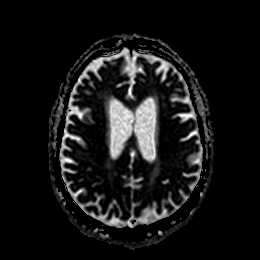
[im 57/57]
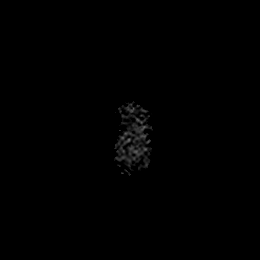

[Series 7: DWI · coronal · 4.0mm · 0.88mm/px · 6 of 92 slices shown (3 of 4)]
[im 1/92]
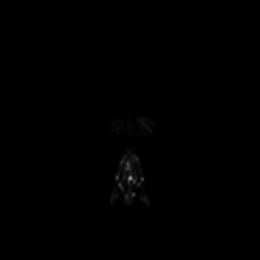
[im 19/92]
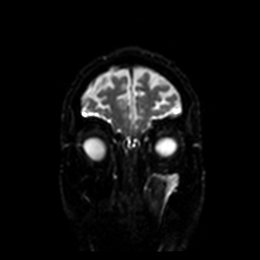
[im 37/92]
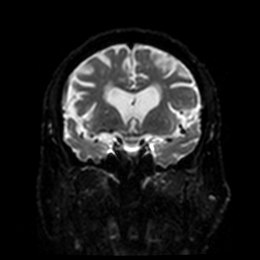
[im 55/92]
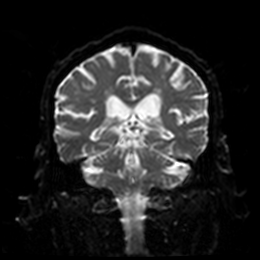
[im 73/92]
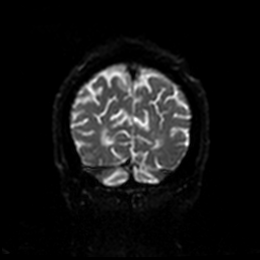
[im 92/92]
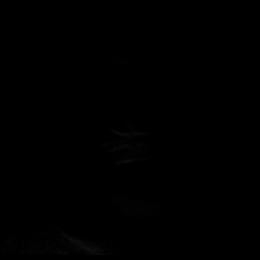

[Series 8: DWI · coronal · 4.0mm · 0.88mm/px · 3 of 46 slices shown (4 of 4)]
[im 1/46]
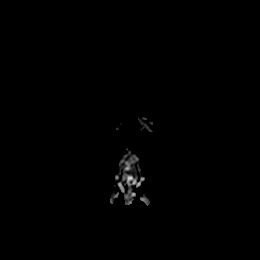
[im 23/46]
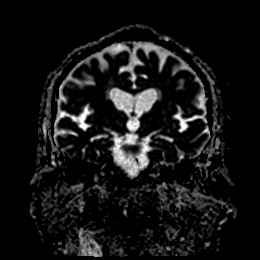
[im 46/46]
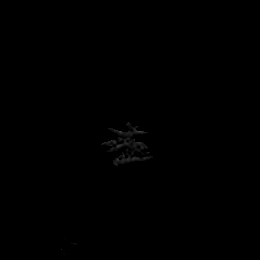

[Series 9: T1 · sagittal · 5.0mm · 0.78mm/px · 2 of 29 slices shown]
[im 1/29]
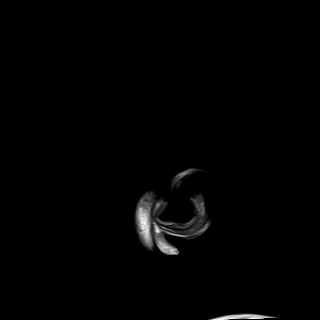
[im 29/29]
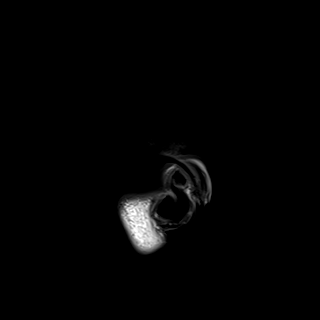

[Series 10: T2 · axial · 5.0mm · 0.78mm/px · z∈[-90,+77]mm · 2 of 29 slices shown (1 of 2)]
[im 1/29]
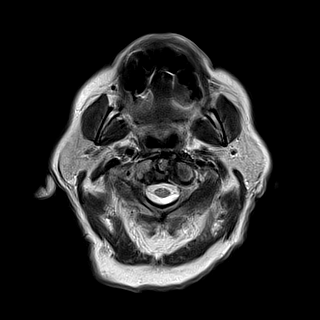
[im 29/29]
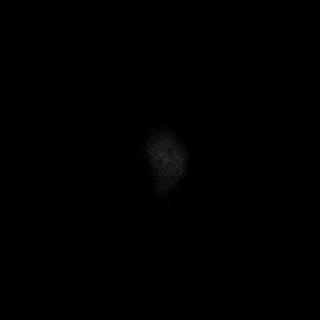

[Series 11: FLAIR · axial · 5.0mm · 0.98mm/px · z∈[-95,+84]mm · 2 of 31 slices shown]
[im 1/31]
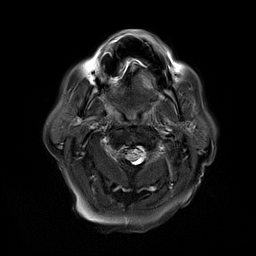
[im 31/31]
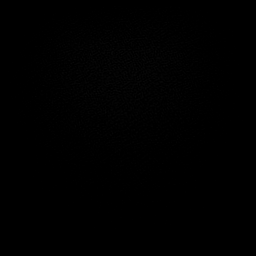

[Series 12: mag_images · axial · 3.0mm · 0.98mm/px · z∈[-92,+84]mm · 4 of 60 slices shown]
[im 1/60]
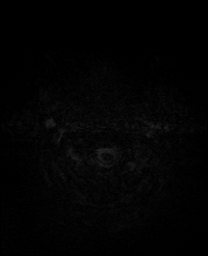
[im 20/60]
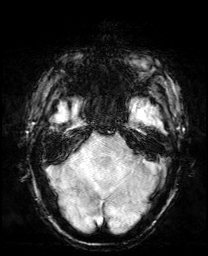
[im 40/60]
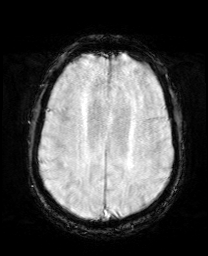
[im 60/60]
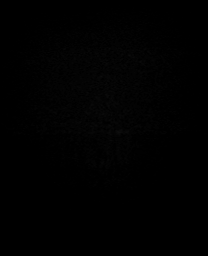

[Series 13: pha_images · axial · 3.0mm · 0.98mm/px · z∈[-92,+72]mm · 3 of 56 slices shown]
[im 1/56]
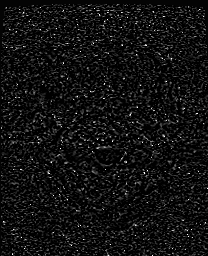
[im 28/56]
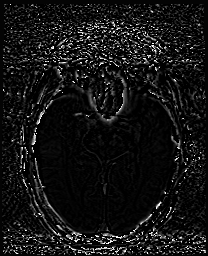
[im 56/56]
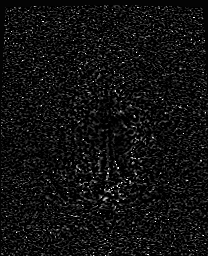

[Series 14: swi_images · axial · 3.0mm · 0.98mm/px · z∈[-92,+84]mm · 4 of 60 slices shown]
[im 1/60]
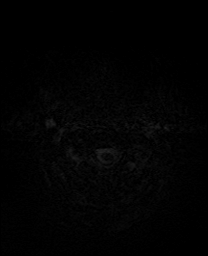
[im 20/60]
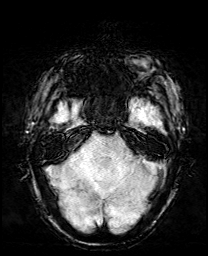
[im 40/60]
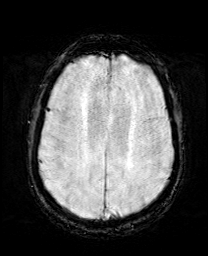
[im 60/60]
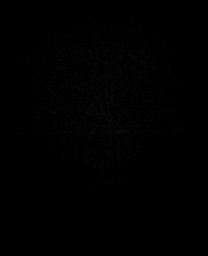

[Series 15: mip_images(sw) · axial · 24.0mm · 0.98mm/px · z∈[-81,+74]mm · 3 of 53 slices shown]
[im 1/53]
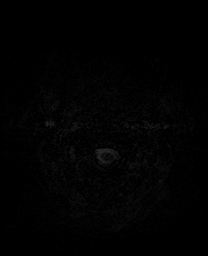
[im 27/53]
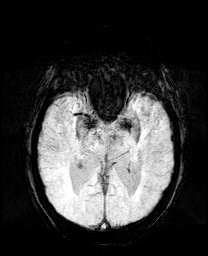
[im 53/53]
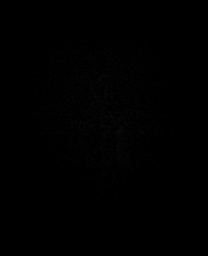

[Series 17: T2 · coronal · 5.0mm · 0.72mm/px · 2 of 39 slices shown (2 of 2)]
[im 1/39]
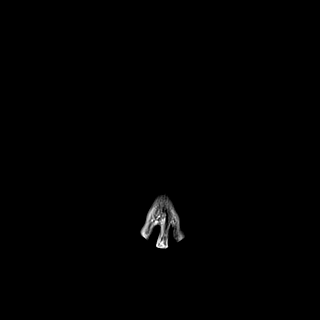
[im 39/39]
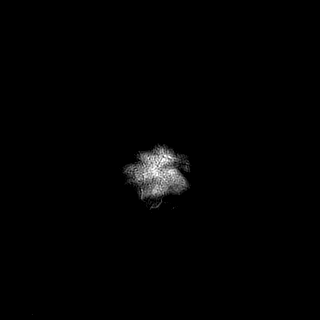

[42 of 48 positions shown; findings below may reference images not displayed]

FINDINGS: Brain: No acute infarction, hemorrhage, hydrocephalus, extra-axial
collection or mass lesion. Chronic small vessel ischemia in the
hemispheric white matter with chronic left centrum semiovale lacune.
Small remote right cerebellar infarction.

Vascular: Major flow voids are preserved.

Skull and upper cervical spine: Normal marrow signal. Cervical spine
degeneration especially affecting C3-4 facets with anterolisthesis.

Sinuses/Orbits: Chronic left maxillary sinus opacification by
peripheral mucosal thickening and central inspissated material, with
atelectasis. Partial left more than right mastoid opacification with
negative nasopharynx. Bilateral cataract resection.
IMPRESSION: 1. No acute or reversible finding.
2. Brain atrophy and chronic ischemic vessel disease.
3. Chronic left maxillary sinusitis and partial mastoid
opacification.

## 2023-01-20 ENCOUNTER — Telehealth: Payer: Self-pay

## 2023-01-20 NOTE — Patient Outreach (Signed)
  Care Coordination   01/20/2023 Name: Caleb Chavez MRN: [id number] DOB: 1940/12/21   Care Coordination Outreach Attempts:  An unsuccessful telephone outreach was attempted today to offer the patient information about available care coordination services. HIPAA compliant message left.   Follow Up Plan:  Additional outreach attempts will be made to offer the patient care coordination information and services.   Encounter Outcome:  No Answer   Care Coordination Interventions:  No, not indicated    George Ina [facility] Care Coordination [phone] direct line

## 2023-01-30 ENCOUNTER — Telehealth: Payer: Self-pay

## 2023-01-30 NOTE — Patient Outreach (Signed)
  Care Coordination   01/30/2023 Name: Caleb Chavez MRN: [id number] DOB: 02-Sep-1940   Care Coordination Outreach Attempts:  A second unsuccessful outreach was attempted today to offer the patient with information about available care coordination services. HIPAA compliant message left.   Follow Up Plan:  Additional outreach attempts will be made to offer the patient care coordination information and services.   Encounter Outcome:  No Answer   Care Coordination Interventions:  No, not indicated    George Ina [facility] Care Coordination [phone] direct line
